# Patient Record
Sex: Female | Born: 1950
Health system: Southern US, Community
[De-identification: ages and names within clinical notes are randomized; demographics above are authoritative.]

## PROBLEM LIST (undated history)

## (undated) DIAGNOSIS — E7800 Pure hypercholesterolemia, unspecified: Secondary | ICD-10-CM

## (undated) DIAGNOSIS — F329 Major depressive disorder, single episode, unspecified: Secondary | ICD-10-CM

## (undated) DIAGNOSIS — I10 Essential (primary) hypertension: Secondary | ICD-10-CM

## (undated) DIAGNOSIS — E669 Obesity, unspecified: Secondary | ICD-10-CM

## (undated) DIAGNOSIS — C439 Malignant melanoma of skin, unspecified: Secondary | ICD-10-CM

## (undated) DIAGNOSIS — E0500 Thyrotoxicosis with diffuse goiter without thyrotoxic crisis or storm: Secondary | ICD-10-CM

## (undated) DIAGNOSIS — F32A Depression, unspecified: Secondary | ICD-10-CM

## (undated) HISTORY — DX: Thyrotoxicosis with diffuse goiter without thyrotoxic crisis or storm: E05.00

## (undated) HISTORY — DX: Essential (primary) hypertension: I10

## (undated) HISTORY — DX: Obesity, unspecified: E66.9

## (undated) HISTORY — PX: CHOLECYSTECTOMY: SHX55

## (undated) HISTORY — DX: Pure hypercholesterolemia, unspecified: E78.00

## (undated) HISTORY — DX: Depression, unspecified: F32.A

## (undated) HISTORY — DX: Major depressive disorder, single episode, unspecified: F32.9

## (undated) HISTORY — PX: TOTAL KNEE ARTHROPLASTY: SHX125

---
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
## 1999-05-21 ENCOUNTER — Other Ambulatory Visit: Admission: RE | Admit: 1999-05-21 | Discharge: 1999-05-21 | Payer: Self-pay | Admitting: Obstetrics and Gynecology

## 2000-05-04 ENCOUNTER — Other Ambulatory Visit: Admission: RE | Admit: 2000-05-04 | Discharge: 2000-05-04 | Payer: Self-pay | Admitting: Emergency Medicine

## 2000-11-27 ENCOUNTER — Emergency Department (HOSPITAL_COMMUNITY): Admission: EM | Admit: 2000-11-27 | Discharge: 2000-11-27 | Payer: Self-pay | Admitting: Emergency Medicine

## 2000-11-27 ENCOUNTER — Encounter: Payer: Self-pay | Admitting: Emergency Medicine

## 2004-02-25 ENCOUNTER — Ambulatory Visit (HOSPITAL_COMMUNITY): Admission: RE | Admit: 2004-02-25 | Discharge: 2004-02-25 | Payer: Self-pay | Admitting: Gastroenterology

## 2005-02-16 ENCOUNTER — Other Ambulatory Visit: Admission: RE | Admit: 2005-02-16 | Discharge: 2005-02-16 | Payer: Self-pay | Admitting: Family Medicine

## 2006-07-14 ENCOUNTER — Inpatient Hospital Stay (HOSPITAL_COMMUNITY): Admission: AD | Admit: 2006-07-14 | Discharge: 2006-07-15 | Payer: Self-pay | Admitting: Otolaryngology

## 2006-07-28 ENCOUNTER — Emergency Department (HOSPITAL_COMMUNITY): Admission: EM | Admit: 2006-07-28 | Discharge: 2006-07-28 | Payer: Self-pay | Admitting: Emergency Medicine

## 2007-12-01 IMAGING — CR DG CHEST 1V PORT
1 series · 1 of 1 positions shown · non-contrast
Comparison: none

CLINICAL DATA: PICC placement

Portable chest at 9797:
No previous for comparison. Right arm PICC line to the low SVC. Lungs clear.
Heart size upper limits normal. Mildly tortuous thoracic aorta. No effusion.
Visualized bones unremarkable.

[view not recorded]
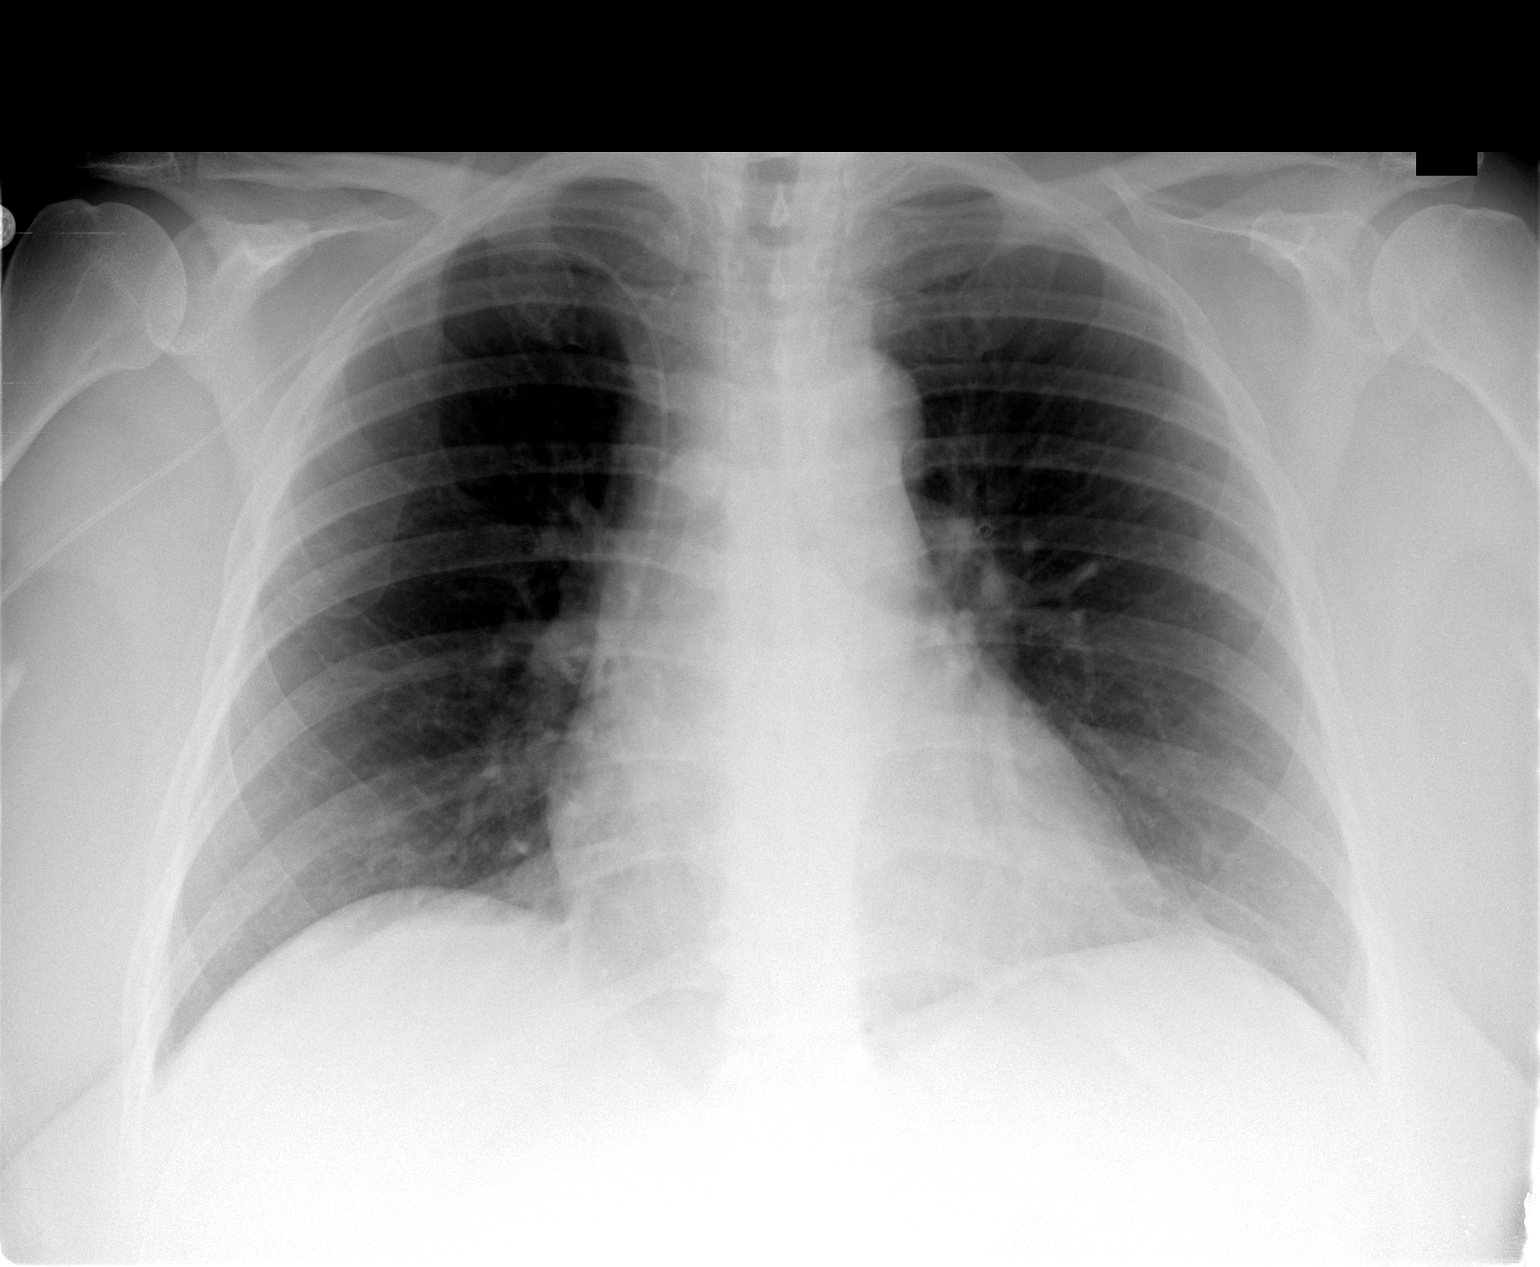

[1 of 1 positions shown; findings below may reference images not displayed]

IMPRESSION: 1. PICC to low SVC.

## 2007-12-15 IMAGING — CR DG CHEST 2V
2 series · 2 of 2 positions shown · non-contrast
Comparison: 07/14/2006

CLINICAL DATA: Syncope. MRSA

CHEST - 2 VIEW

[view not recorded (1 of 2)]
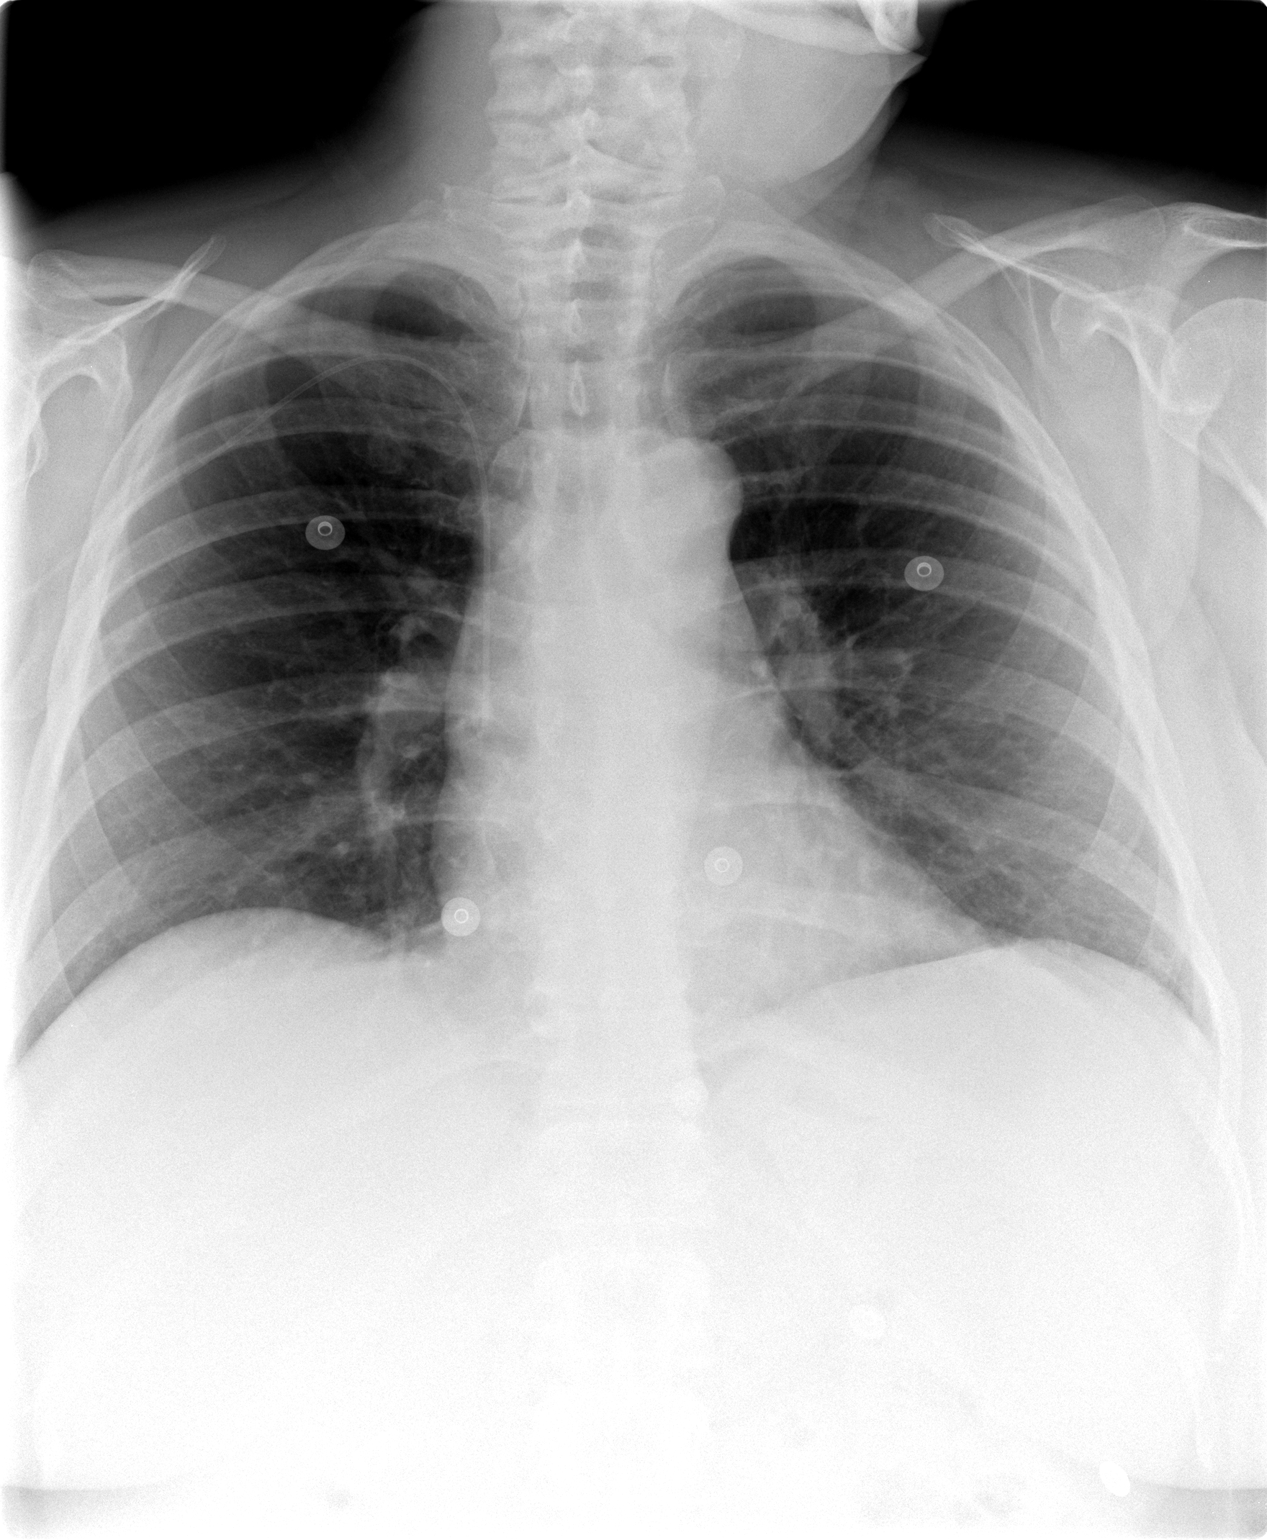

[view not recorded (2 of 2)]
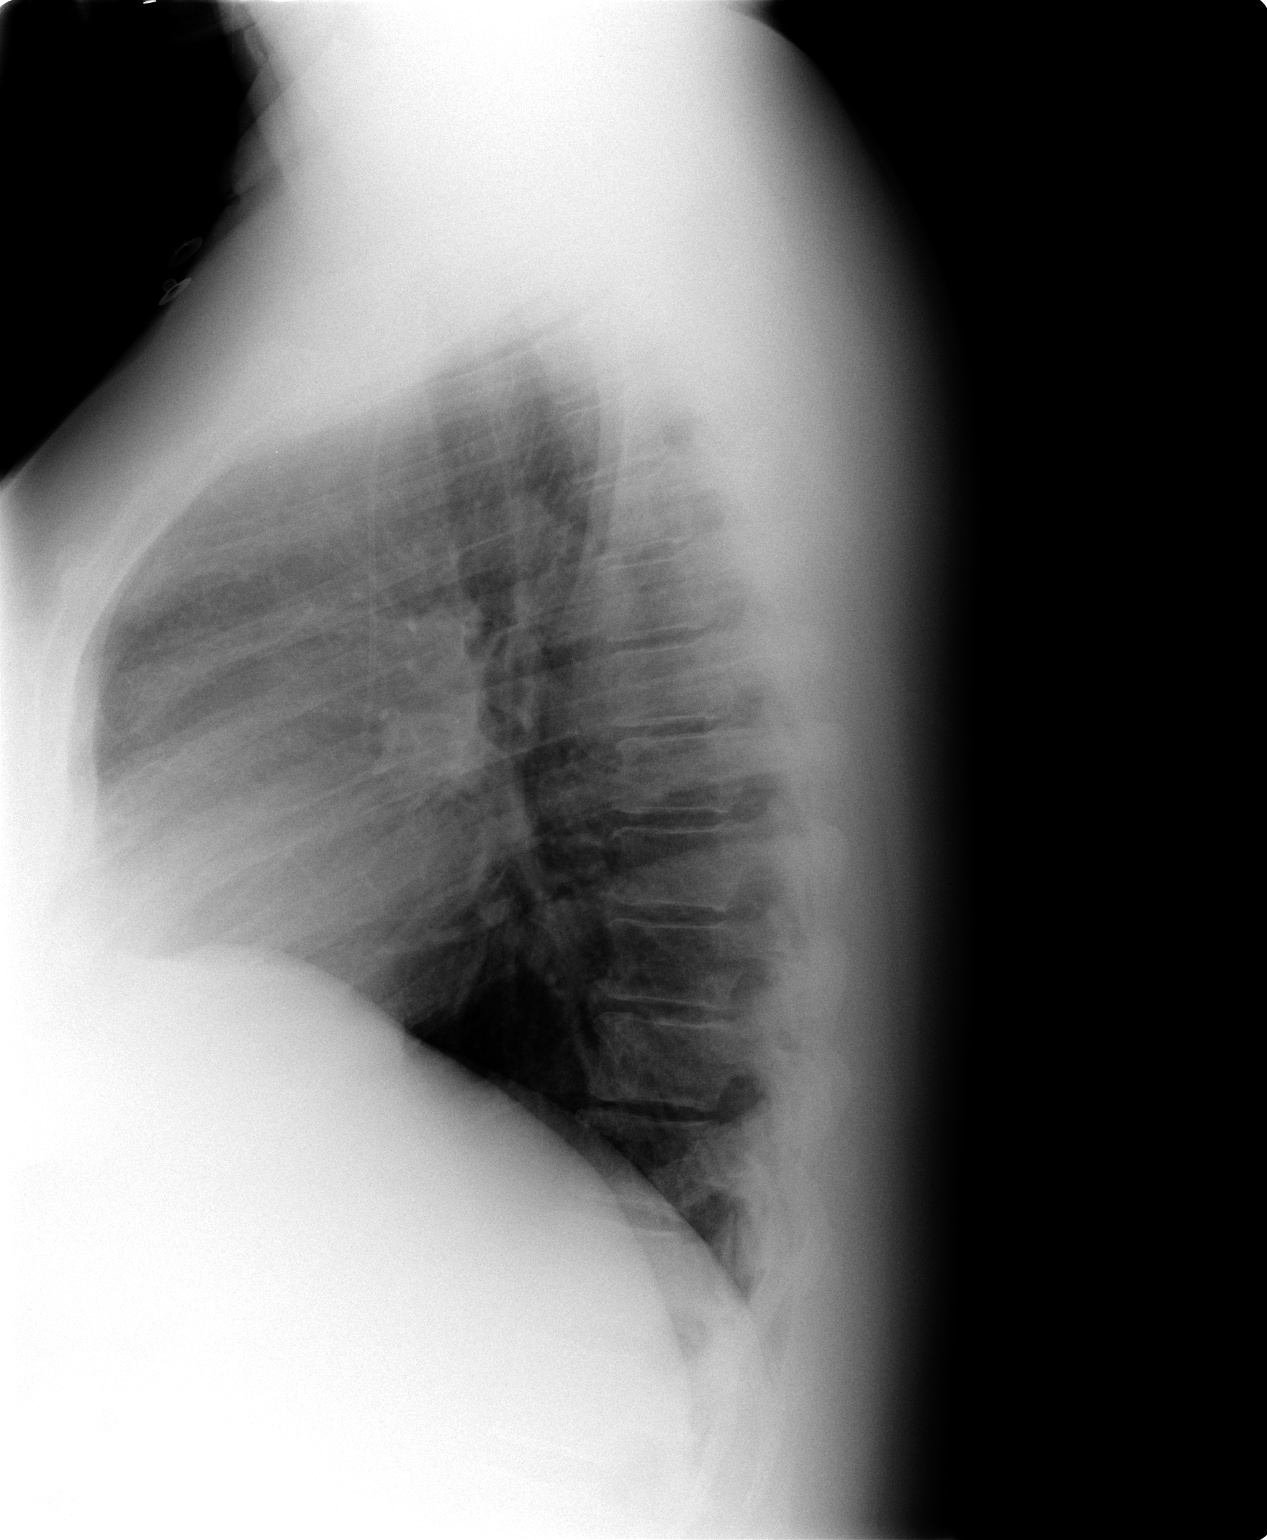

[2 of 2 positions shown; findings below may reference images not displayed]

FINDINGS: Right-sided PICC line tip projects over the superior vena cava. The
lungs appear clear. The heart and mediastinum appear unremarkable. No pleural
fluid is identified.

IMPRESSION

1. No active cardiopulmonary disease is radiographically apparent. PICC line tip
is in the superior vena cava.

## 2009-11-06 ENCOUNTER — Inpatient Hospital Stay (HOSPITAL_COMMUNITY): Admission: RE | Admit: 2009-11-06 | Discharge: 2009-11-09 | Payer: Self-pay | Admitting: Orthopaedic Surgery

## 2009-11-12 ENCOUNTER — Emergency Department (HOSPITAL_COMMUNITY): Admission: EM | Admit: 2009-11-12 | Discharge: 2009-11-12 | Payer: Self-pay | Admitting: Emergency Medicine

## 2009-11-23 ENCOUNTER — Emergency Department (HOSPITAL_COMMUNITY): Admission: EM | Admit: 2009-11-23 | Discharge: 2009-11-23 | Payer: Self-pay | Admitting: Emergency Medicine

## 2010-03-25 ENCOUNTER — Other Ambulatory Visit: Admission: RE | Admit: 2010-03-25 | Discharge: 2010-03-25 | Payer: Self-pay | Admitting: Family Medicine

## 2010-12-02 LAB — BASIC METABOLIC PANEL
BUN: 10 mg/dL (ref 6–23)
BUN: 6 mg/dL (ref 6–23)
CO2: 35 mEq/L — ABNORMAL HIGH (ref 19–32)
Calcium: 8.8 mg/dL (ref 8.4–10.5)
Calcium: 8.9 mg/dL (ref 8.4–10.5)
Chloride: 101 mEq/L (ref 96–112)
Chloride: 99 mEq/L (ref 96–112)
Creatinine, Ser: 0.77 mg/dL (ref 0.4–1.2)
Creatinine, Ser: 0.77 mg/dL (ref 0.4–1.2)
GFR calc Af Amer: >60 mL/min (ref >60)
GFR calc Af Amer: >60 mL/min (ref >60)
GFR calc non Af Amer: >60 mL/min (ref >60)
Glucose, Bld: 117 mg/dL — ABNORMAL HIGH (ref 70–99)
Glucose, Bld: 125 mg/dL — ABNORMAL HIGH (ref 70–99)
Sodium: 139 mEq/L (ref 135–145)

## 2010-12-02 LAB — CBC
HCT: 32.7 % — ABNORMAL LOW (ref 36.0–46.0)
HCT: 42.3 % (ref 36.0–46.0)
Hemoglobin: 11.9 g/dL — ABNORMAL LOW (ref 12.0–15.0)
Hemoglobin: 14.7 g/dL (ref 12.0–15.0)
MCHC: 35 g/dL (ref 30.0–36.0)
MCHC: 35.1 g/dL (ref 30.0–36.0)
MCHC: 35.3 g/dL (ref 30.0–36.0)
MCV: 89.7 fL (ref 78.0–100.0)
MCV: 89.7 fL (ref 78.0–100.0)
MCV: 90.1 fL (ref 78.0–100.0)
Platelets: 152 10*3/uL (ref 150–400)
Platelets: 200 10*3/uL (ref 150–400)
RBC: 3.79 MIL/uL — ABNORMAL LOW (ref 3.87–5.11)
RDW: 13.1 % (ref 11.5–15.5)
RDW: 13.2 % (ref 11.5–15.5)
RDW: 13.5 % (ref 11.5–15.5)
WBC: 8.1 10*3/uL (ref 4.0–10.5)
WBC: 8.9 10*3/uL (ref 4.0–10.5)

## 2010-12-02 LAB — PROTIME-INR
INR: 1.49 (ref 0.00–1.49)
Prothrombin Time: 21.4 seconds — ABNORMAL HIGH (ref 11.6–15.2)

## 2011-01-29 NOTE — Discharge Summary (Signed)
Penny Dawson, Penny Dawson               ACCOUNT NO.:  [id number]   MEDICAL RECORD NO.:  [id number]          PATIENT TYPE:  INP   LOCATION:  6706                         FACILITY:  [facility]   PHYSICIAN:  Lucky Cowboy, MD         DATE OF BIRTH:  [birth year]   DATE OF ADMISSION:  07/14/2006  DATE OF DISCHARGE:  07/15/2006                                 DISCHARGE SUMMARY   DISCHARGE DIAGNOSES:  1. Submental cellulitis with reactive lymphadenitis, most likely related      to methicillin-resistant staph aureus infection.  2. Graves' disease.  3. Hypertension.   HISTORY:  This patient is a 60 year old female with a 3-week history of a  submental tender swelling, not improved after 1 week of clindamycin.  She  was felt to be suffering from probable MRSA-related infection and for this  reason, she was admitted to the hospital for IV vancomycin.  While in the  hospital, she underwent a PICC line placement.  She underwent two doses of  vancomycin.  As expected, there was no change in the submental lymph node  size or tenderness level.  She was discharged to home on a planned 14-day IV  antibiotic therapy of vancomycin.  Vancomycin trough will be drawn tomorrow  morning.   Followup appointment is in the office on July 25, 2006 at 8:30 a.m.   DISCHARGE MEDICATIONS:  Baby aspirin, Maxzide, Zoloft, hold on allergy  shots, multivitamin, and then vancomycin 1.5 grams IV b.i.d.  Also, the  patient is to use Bactroban ointment in each side of the nasal cavities  b.i.d.      Lucky Cowboy, MD  Electronically Signed     SJ/MEDQ  D:  07/15/2006  T:  07/15/2006  Job:  [id number]   cc:   [facility]  Carola J. Gerri Spore, M.D.

## 2011-01-29 NOTE — Op Note (Signed)
Penny Dawson, MARION                              ACCOUNT NO.:  [id number]   MEDICAL RECORD NO.:  [id number]                   PATIENT TYPE:  AMB   LOCATION:  ENDO                                 FACILITY:  [facility]   PHYSICIAN:  James L. Malon Kindle., M.D.          DATE OF BIRTH:  11-09-1950   DATE OF PROCEDURE:  02/25/2004  DATE OF DISCHARGE:                                 OPERATIVE REPORT   PROCEDURE:  Colonoscopy.   MEDICATIONS:  Fentanyl 50 mcg, Versed 6 mg IV.   SCOPE:  Olympus pediatric adjustable colonoscope.   DESCRIPTION OF PROCEDURE:  The procedure had been explained to the patient  and consent obtained.  With the patient in the left lateral decubitus  position, the Olympus pediatric adjustable scope was inserted and advanced.  The prep was excellent.  We were able to advance to the cecum without a  great deal of difficulty.  The ileocecal valve and appendiceal orifice were  seen.  The scope was withdrawn and the cecum, ascending colon, transverse  colon, descending and sigmoid colon were seen well.  There were a few  diverticula in the sigmoid colon.  No other gross abnormalities.  The scope  was withdrawn into the rectum.  The rectum was free of polyps and lesions.  The patient tolerated the procedure well. Was maintained on a low-flow  oxygen and pulse oximetry throughout the procedure.   ASSESSMENT:  1. Normal screening colonoscopy. V76.51.  2. Diverticulosis.  562.10.   PLAN:  Will recommend yearly hemoccults and will give her a little  diverticulosis information sheet.                                               James L. Malon Kindle., M.D.    Waldron Session  D:  02/25/2004  T:  02/25/2004  Job:  [id number]   cc:   Otilio Connors. Gerri Spore, M.D.  [address]  [place]  [place] [address]  Fax: [phone]

## 2013-01-23 ENCOUNTER — Other Ambulatory Visit: Payer: Self-pay | Admitting: Dermatology

## 2014-04-09 ENCOUNTER — Other Ambulatory Visit (HOSPITAL_COMMUNITY)
Admission: RE | Admit: 2014-04-09 | Discharge: 2014-04-09 | Disposition: A | Discharge status: Home / Self Care | Payer: BC Managed Care – PPO | Source: Ambulatory Visit | Attending: Family Medicine | Admitting: Family Medicine

## 2014-04-09 ENCOUNTER — Other Ambulatory Visit: Payer: Self-pay | Admitting: Family Medicine

## 2014-04-09 DIAGNOSIS — Z124 Encounter for screening for malignant neoplasm of cervix: Secondary | ICD-10-CM | POA: Insufficient documentation

## 2014-04-09 DIAGNOSIS — Z1151 Encounter for screening for human papillomavirus (HPV): Secondary | ICD-10-CM | POA: Insufficient documentation

## 2014-04-11 LAB — CYTOLOGY - PAP

## 2015-02-12 ENCOUNTER — Other Ambulatory Visit: Payer: Self-pay | Admitting: Family Medicine

## 2015-02-12 DIAGNOSIS — R229 Localized swelling, mass and lump, unspecified: Secondary | ICD-10-CM

## 2015-02-19 ENCOUNTER — Ambulatory Visit
Admission: RE | Admit: 2015-02-19 | Discharge: 2015-02-19 | Disposition: A | Discharge status: Home / Self Care | Payer: Medicare Other | Source: Ambulatory Visit | Attending: Family Medicine | Admitting: Family Medicine

## 2015-02-19 DIAGNOSIS — R229 Localized swelling, mass and lump, unspecified: Secondary | ICD-10-CM

## 2015-03-24 ENCOUNTER — Ambulatory Visit
Admission: RE | Admit: 2015-03-24 | Discharge: 2015-03-24 | Disposition: A | Discharge status: Home / Self Care | Payer: Medicare Other | Source: Ambulatory Visit | Attending: Family Medicine | Admitting: Family Medicine

## 2015-03-24 ENCOUNTER — Other Ambulatory Visit: Payer: Self-pay | Admitting: Family Medicine

## 2015-03-24 DIAGNOSIS — S39012A Strain of muscle, fascia and tendon of lower back, initial encounter: Secondary | ICD-10-CM

## 2015-04-10 ENCOUNTER — Encounter: Payer: Self-pay | Admitting: *Deleted

## 2015-04-10 ENCOUNTER — Telehealth: Payer: Self-pay | Admitting: *Deleted

## 2015-04-11 ENCOUNTER — Encounter: Payer: Self-pay | Admitting: Internal Medicine

## 2015-04-11 ENCOUNTER — Ambulatory Visit (INDEPENDENT_AMBULATORY_CARE_PROVIDER_SITE_OTHER): Payer: Medicare Other | Admitting: Internal Medicine

## 2015-04-11 VITALS — BP 136/82 | HR 76 | Ht 65.0 in | Wt 262.8 lb

## 2015-04-11 DIAGNOSIS — R0602 Shortness of breath: Secondary | ICD-10-CM

## 2015-04-11 NOTE — Telephone Encounter (Signed)
Unable to reach pt to get family hx/status

## 2015-04-11 NOTE — Progress Notes (Addendum)
Cardiology Office Note   Date:  04/11/2015   ID:  Penny Dawson, DOB 05-24-1951, MRN [id number]  PCP:  No primary care provider on file.  Cardiologist:   Dorris Carnes, MD   No chief complaint on file.  Presents for evaluation of djzziness and dyspnea   History of Present Illness: Penny Dawson is a 64 y.o. female with a history of  HTN, HL, obesity   Has a long history of dizziness.  About 1 /1/2 month ago pt was working around house  It was hot  She got warm.  Nauseated  Dizzy  LAid down then event passed  Did not pass out   She has had tendency to this since childhood.    Passed out in Dr office 5 yars ago  Testing was negative  Including stress  Cardiologist was T Agricultural engineer.   Occurred while sick  Having testing done  Felt bad    Bowel moving frequently  Loose.  Last normal 2 month  Travelled to [place] in interval    More tired than used to be  SOB with walking up stairs  More SOB than usual Denies CP    Current Outpatient Prescriptions  Medication Sig Dispense Refill  . aspirin 81 MG tablet Take 81 mg by mouth daily.    . Biotin (BIOTIN 5000) 5 MG CAPS Take 1 capsule by mouth daily.    Marland Kitchen ibuprofen (ADVIL,MOTRIN) 200 MG tablet Take 200 mg by mouth every 6 (six) hours as needed.    . loratadine (CLARITIN) 10 MG tablet Take 10 mg by mouth daily.    . Multiple Vitamins-Minerals (MULTIVITAMIN & MINERAL PO) Take 1 capsule by mouth daily.    . sertraline (ZOLOFT) 100 MG tablet Take 50 mg by mouth daily.     Marland Kitchen triamterene-hydrochlorothiazide (DYAZIDE) 37.5-25 MG per capsule Take 1 capsule by mouth daily.     No current facility-administered medications for this visit.    Allergies:   Mycinaire nasal mist   Past Medical History  Diagnosis Date  . Obesity   . Graves disease   . Hypertension   . Elevated cholesterol   . Depression     Past Surgical History  Procedure Laterality Date  . Cholecystectomy    . Total knee arthroplasty      Left     Social History:   The patient  reports that she has never smoked. She does not have any smokeless tobacco history on file. She reports that she drinks alcohol. She reports that she does not use illicit drugs.   Family History:  The patient's family history includes Diabetes in her father; Heart attack (age of onset: 54) in her father; Heart disease in her father; Stroke in her father.    ROS:  Please see the history of present illness. All other systems are reviewed and  Negative to the above problem except as noted.    PHYSICAL EXAM: VS:  BP 136/82 mmHg  Pulse 76  Ht 5\' 5"  (1.651 m)  Wt 262 lb 12.8 oz (119.205 kg)  BMI 43.73 kg/m2  GEN: Well nourished, well developed, in no acute distress HEENT: normal Neck: no JVD, carotid bruits, or masses Cardiac: RRR; no murmurs, rubs, or gallops,no edema  Respiratory:  clear to auscultation bilaterally, normal work of breathing GI: soft, nontender, nondistended, + BS  No hepatomegaly  MS: no deformity Moving all extremities   Skin: warm and dry, no rash Neuro:  Strength and sensation are intact  Psych: euthymic mood, full affect   EKG:  EKG is ordered today.  SR 77 bpm  Occasional PVC  Nonspecific ST T wave changes      Lipid Panel No results found for: CHOL, TRIG, HDL, CHOLHDL, VLDL, LDLCALC, LDLDIRECT    Wt Readings from Last 3 Encounters:  04/11/15 262 lb 12.8 oz (119.205 kg)      ASSESSMENT AND PLAN:  1.  Dizziness, syncope  Pt has had a long history  At least some are vasovagal  I am not convinced of arrhythmia  Watch for symptoms and sit down  Stay hydrated  2  SOB  THis is more concerning  Dyspnea new over the past couple  FHx signif for CAD in dad and son I would recomm an echo to evalaute diastoic and systolic function  I would also recomm a stress myoview to eval for ischemia    F/U is based on test results.     Signed, Dorris Carnes, MD  04/11/2015 12:07 PM    [address], [place], [place]   [address] Phone: [phone]; Fax: [phone]

## 2015-04-11 NOTE — Patient Instructions (Signed)
Medication Instructions:  Your physician recommends that you continue on your current medications as directed. Please refer to the Current Medication list given to you today.   Labwork: None ordered  Testing/Procedures: Your physician has requested that you have en exercise stress myoview. For further information please visit [email or web address]. Please follow instruction sheet, as given.  Your physician has requested that you have an echocardiogram. Echocardiography is a painless test that uses sound waves to create images of your heart. It provides your doctor with information about the size and shape of your heart and how well your heart's chambers and valves are working. This procedure takes approximately one hour. There are no restrictions for this procedure.    Follow-Up: Your physician recommends that you schedule a follow-up appointment---will call after test results with follow up   Any Other Special Instructions Will Be Listed Below (If Applicable).

## 2015-04-14 ENCOUNTER — Ambulatory Visit (HOSPITAL_COMMUNITY): Payer: Medicare Other | Attending: Cardiovascular Disease

## 2015-04-14 DIAGNOSIS — R0602 Shortness of breath: Secondary | ICD-10-CM | POA: Diagnosis not present

## 2015-04-14 MED ORDER — REGADENOSON 0.4 MG/5ML IV SOLN: 0.4000 mg | Freq: Once | INTRAVENOUS | Status: AC

## 2015-04-14 MED ORDER — TECHNETIUM TC 99M SESTAMIBI GENERIC - CARDIOLITE
32.1000 | Freq: Once | INTRAVENOUS | Status: AC | PRN
  Administered 2015-04-14: 32.1 via INTRAVENOUS

## 2015-04-14 MED ORDER — TECHNETIUM TC 99M SESTAMIBI GENERIC - CARDIOLITE
10.2000 | Freq: Once | INTRAVENOUS | Status: AC | PRN
Start: 2015-04-14 — End: 2015-04-14
  Administered 2015-04-14: 10 via INTRAVENOUS

## 2015-04-15 ENCOUNTER — Ambulatory Visit (HOSPITAL_COMMUNITY): Payer: Medicare Other | Attending: Cardiology

## 2015-04-15 ENCOUNTER — Ambulatory Visit (HOSPITAL_BASED_OUTPATIENT_CLINIC_OR_DEPARTMENT_OTHER): Payer: Medicare Other

## 2015-04-15 ENCOUNTER — Other Ambulatory Visit: Payer: Self-pay

## 2015-04-15 DIAGNOSIS — I10 Essential (primary) hypertension: Secondary | ICD-10-CM | POA: Insufficient documentation

## 2015-04-15 DIAGNOSIS — R42 Dizziness and giddiness: Secondary | ICD-10-CM | POA: Diagnosis present

## 2015-04-15 DIAGNOSIS — I5189 Other ill-defined heart diseases: Secondary | ICD-10-CM | POA: Insufficient documentation

## 2015-04-15 DIAGNOSIS — R0602 Shortness of breath: Secondary | ICD-10-CM

## 2015-04-15 LAB — MYOCARDIAL PERFUSION IMAGING
Estimated workload: 6.4 METS
Exercise duration (min): 4 min
Exercise duration (sec): 31 s
LV dias vol: 92 mL
LV sys vol: 37 mL
MPHR: 155 {beats}/min
Peak HR: 141 {beats}/min
Percent HR: 90 %
RATE: 0.42
RPE: 17
Rest HR: 76 {beats}/min
SDS: 1
SRS: 1
SSS: 2
TID: 1.03

## 2015-04-15 MED ORDER — TECHNETIUM TC 99M SESTAMIBI GENERIC - CARDIOLITE
32.7000 | Freq: Once | INTRAVENOUS | Status: AC | PRN
  Administered 2015-04-15: 32.7 via INTRAVENOUS

## 2015-05-21 ENCOUNTER — Ambulatory Visit: Payer: Medicare Other | Admitting: Cardiology

## 2017-06-17 ENCOUNTER — Other Ambulatory Visit: Payer: Self-pay

## 2017-11-17 ENCOUNTER — Other Ambulatory Visit: Payer: Self-pay | Admitting: Physician Assistant

## 2017-11-17 DIAGNOSIS — C439 Malignant melanoma of skin, unspecified: Secondary | ICD-10-CM

## 2017-11-17 HISTORY — DX: Malignant melanoma of skin, unspecified: C43.9

## 2017-12-15 ENCOUNTER — Other Ambulatory Visit: Payer: Self-pay | Admitting: Dermatology

## 2018-03-21 ENCOUNTER — Other Ambulatory Visit: Payer: Self-pay | Admitting: Physician Assistant

## 2018-08-31 ENCOUNTER — Other Ambulatory Visit: Payer: Self-pay | Admitting: Physician Assistant

## 2018-08-31 DIAGNOSIS — H9120 Sudden idiopathic hearing loss, unspecified ear: Secondary | ICD-10-CM

## 2018-09-12 ENCOUNTER — Ambulatory Visit
Admission: RE | Admit: 2018-09-12 | Discharge: 2018-09-12 | Disposition: A | Discharge status: Home / Self Care | Payer: Medicare Other | Source: Ambulatory Visit | Attending: Physician Assistant | Admitting: Physician Assistant

## 2018-09-12 DIAGNOSIS — H9120 Sudden idiopathic hearing loss, unspecified ear: Secondary | ICD-10-CM

## 2018-09-12 MED ORDER — GADOBENATE DIMEGLUMINE 529 MG/ML IV SOLN
20.0000 mL | Freq: Once | INTRAVENOUS | Status: AC | PRN
  Administered 2018-09-12: 20 mL via INTRAVENOUS

## 2018-09-22 ENCOUNTER — Other Ambulatory Visit: Payer: Self-pay | Admitting: Physician Assistant

## 2018-11-28 ENCOUNTER — Other Ambulatory Visit: Payer: Self-pay | Admitting: Physician Assistant

## 2019-03-23 ENCOUNTER — Other Ambulatory Visit: Payer: Self-pay | Admitting: Physician Assistant

## 2019-04-05 ENCOUNTER — Encounter: Payer: Self-pay | Admitting: *Deleted

## 2020-03-19 ENCOUNTER — Encounter: Payer: Self-pay | Admitting: *Deleted

## 2020-03-25 ENCOUNTER — Ambulatory Visit: Payer: Medicare Other | Admitting: Physician Assistant

## 2020-04-01 ENCOUNTER — Other Ambulatory Visit: Payer: Self-pay

## 2020-04-01 ENCOUNTER — Ambulatory Visit: Payer: Medicare PPO | Admitting: Physician Assistant

## 2020-04-01 ENCOUNTER — Encounter: Payer: Self-pay | Admitting: Physician Assistant

## 2020-04-01 DIAGNOSIS — Z86006 Personal history of melanoma in-situ: Secondary | ICD-10-CM

## 2020-04-01 DIAGNOSIS — L821 Other seborrheic keratosis: Secondary | ICD-10-CM | POA: Diagnosis not present

## 2020-04-01 DIAGNOSIS — Z1283 Encounter for screening for malignant neoplasm of skin: Secondary | ICD-10-CM | POA: Diagnosis not present

## 2020-04-01 DIAGNOSIS — L814 Other melanin hyperpigmentation: Secondary | ICD-10-CM

## 2020-04-01 DIAGNOSIS — D1801 Hemangioma of skin and subcutaneous tissue: Secondary | ICD-10-CM | POA: Diagnosis not present

## 2020-04-01 DIAGNOSIS — D1800 Hemangioma unspecified site: Secondary | ICD-10-CM

## 2020-04-01 DIAGNOSIS — D229 Melanocytic nevi, unspecified: Secondary | ICD-10-CM

## 2020-04-01 DIAGNOSIS — L578 Other skin changes due to chronic exposure to nonionizing radiation: Secondary | ICD-10-CM

## 2020-04-01 NOTE — Progress Notes (Signed)
   Follow-Up Visit   Subjective  Penny Dawson is a 69 y.o. female who presents for the following: Follow-up (18mo).   The following portions of the chart were reviewed this encounter and updated as appropriate: Tobacco  Allergies  Meds  Problems  Med Hx  Surg Hx  Fam Hx      Objective  Well appearing patient in no apparent distress; mood and affect are within normal limits.  A full examination was performed including scalp, head, eyes, ears, nose, lips, neck, chest, axillae, abdomen, back, buttocks, bilateral upper extremities, bilateral lower extremities, hands, feet, fingers, toes, fingernails, and toenails. All findings within normal limits unless otherwise noted below.  Objective  Left Shoulder - Anterior: Scar clear, No DN or signs of NMSC No LAD  Objective  Left Abdomen (side) - Lower, Left Popliteal Fossa, Left Suprapubic Area, Left Thigh - Anterior (5), Left Upper Back, Mid Back, Right Thigh - Anterior (4), Right Thigh - Posterior, Right Upper Arm - Anterior, Right Upper Back: Stuck-on, waxy, tan-brown papules and plaques. --Discussed benign etiology and prognosis.    Assessment & Plan  Screening exam for skin cancer Head - Anterior (Face)  Personal history of melanoma in-situ Left Shoulder - Anterior  Seborrheic keratosis Chest - Medial (Center)  Hemangioma of skin (17) Left Popliteal Fossa; Right Upper Arm - Anterior; Left Thigh - Anterior (5); Right Thigh - Anterior (4); Right Thigh - Posterior; Left Abdomen (side) - Lower; Left Suprapubic Area; Left Upper Back; Right Upper Back; Mid Back  observe  Lentigines - Scattered tan macules - Discussed due to sun exposure - Benign, observe - Call for any changes  Seborrheic Keratoses - Stuck-on, waxy, tan-brown papules and plaques  - Discussed benign etiology and prognosis. - Observe - Call for any changes  Melanocytic Nevi - Tan-brown and/or pink-flesh-colored symmetric macules and papules - Benign  appearing on exam today - Observation - Call clinic for new or changing moles - Recommend daily use of broad spectrum spf 30+ sunscreen to sun-exposed areas.   Hemangiomas - Red papules - Discussed benign nature - Observe - Call for any changes  Actinic Damage - diffuse scaly erythematous macules with underlying dyspigmentation - Recommend daily broad spectrum sunscreen SPF 30+ to sun-exposed areas, reapply every 2 hours as needed.  - Call for new or changing lesions.  Skin cancer screening performed today. Full body   I, Amaree Leeper, PA-C, have reviewed all documentation's for this visit.  The documentation on 04/01/20 for the exam, diagnosis, procedures and orders are all accurate and complete.

## 2020-08-14 DIAGNOSIS — H353132 Nonexudative age-related macular degeneration, bilateral, intermediate dry stage: Secondary | ICD-10-CM | POA: Diagnosis not present

## 2020-08-25 DIAGNOSIS — E785 Hyperlipidemia, unspecified: Secondary | ICD-10-CM | POA: Diagnosis not present

## 2020-08-25 DIAGNOSIS — E0590 Thyrotoxicosis, unspecified without thyrotoxic crisis or storm: Secondary | ICD-10-CM | POA: Diagnosis not present

## 2020-08-25 DIAGNOSIS — I10 Essential (primary) hypertension: Secondary | ICD-10-CM | POA: Diagnosis not present

## 2020-08-25 DIAGNOSIS — E119 Type 2 diabetes mellitus without complications: Secondary | ICD-10-CM | POA: Diagnosis not present

## 2020-10-07 ENCOUNTER — Encounter: Payer: Self-pay | Admitting: Physician Assistant

## 2020-10-07 ENCOUNTER — Other Ambulatory Visit: Payer: Self-pay

## 2020-10-07 ENCOUNTER — Ambulatory Visit: Payer: Medicare PPO | Admitting: Physician Assistant

## 2020-10-07 DIAGNOSIS — Z1283 Encounter for screening for malignant neoplasm of skin: Secondary | ICD-10-CM | POA: Diagnosis not present

## 2020-10-07 DIAGNOSIS — Z85820 Personal history of malignant melanoma of skin: Secondary | ICD-10-CM | POA: Diagnosis not present

## 2020-10-07 DIAGNOSIS — D1800 Hemangioma unspecified site: Secondary | ICD-10-CM

## 2020-10-07 NOTE — Progress Notes (Signed)
   Follow-Up Visit   Subjective  Penny Dawson is a 70 y.o. female who presents for the following: Annual Exam (No new concerns).   The following portions of the chart were reviewed this encounter and updated as appropriate:  Tobacco  Allergies  Meds  Problems  Med Hx  Surg Hx  Fam Hx      Objective  Well appearing patient in no apparent distress; mood and affect are within normal limits.  A full examination was performed including scalp, head, eyes, ears, nose, lips, neck, chest, axillae, abdomen, back, buttocks, bilateral upper extremities, bilateral lower extremities, hands, feet, fingers, toes, fingernails, and toenails. All findings within normal limits unless otherwise noted below. No LAD  Objective  Left Outer Deltoid: White scar- clear  Objective  head to toe: Full body skin examination- no atypical moles or non mole skin cancer  Objective  scattered trunk: Multiple red raised papule   Assessment & Plan  Personal history of malignant melanoma of skin Left Outer Deltoid  Yearly skin check  Encounter for screening for malignant neoplasm of skin head to toe  Yearly skin check  Hemangioma, unspecified site scattered trunk  Okay to leave if stable    I, Orson Rho, PA-C, have reviewed all documentation's for this visit.  The documentation on 10/07/20 for the exam, diagnosis, procedures and orders are all accurate and complete.

## 2020-11-19 DIAGNOSIS — E0590 Thyrotoxicosis, unspecified without thyrotoxic crisis or storm: Secondary | ICD-10-CM | POA: Diagnosis not present

## 2020-11-19 DIAGNOSIS — I10 Essential (primary) hypertension: Secondary | ICD-10-CM | POA: Diagnosis not present

## 2020-11-19 DIAGNOSIS — E785 Hyperlipidemia, unspecified: Secondary | ICD-10-CM | POA: Diagnosis not present

## 2020-11-19 DIAGNOSIS — E119 Type 2 diabetes mellitus without complications: Secondary | ICD-10-CM | POA: Diagnosis not present

## 2020-11-24 DIAGNOSIS — H8103 Meniere's disease, bilateral: Secondary | ICD-10-CM | POA: Diagnosis not present

## 2020-11-24 DIAGNOSIS — Z6841 Body Mass Index (BMI) 40.0 and over, adult: Secondary | ICD-10-CM | POA: Diagnosis not present

## 2020-11-24 DIAGNOSIS — F330 Major depressive disorder, recurrent, mild: Secondary | ICD-10-CM | POA: Diagnosis not present

## 2020-11-24 DIAGNOSIS — E1165 Type 2 diabetes mellitus with hyperglycemia: Secondary | ICD-10-CM | POA: Diagnosis not present

## 2020-11-24 DIAGNOSIS — E785 Hyperlipidemia, unspecified: Secondary | ICD-10-CM | POA: Diagnosis not present

## 2020-11-24 DIAGNOSIS — H3530 Unspecified macular degeneration: Secondary | ICD-10-CM | POA: Diagnosis not present

## 2020-11-24 DIAGNOSIS — I10 Essential (primary) hypertension: Secondary | ICD-10-CM | POA: Diagnosis not present

## 2020-12-25 DIAGNOSIS — M720 Palmar fascial fibromatosis [Dupuytren]: Secondary | ICD-10-CM | POA: Diagnosis not present

## 2020-12-25 DIAGNOSIS — H353132 Nonexudative age-related macular degeneration, bilateral, intermediate dry stage: Secondary | ICD-10-CM | POA: Diagnosis not present

## 2021-01-19 ENCOUNTER — Other Ambulatory Visit: Payer: Self-pay

## 2021-01-19 DIAGNOSIS — G8918 Other acute postprocedural pain: Secondary | ICD-10-CM | POA: Diagnosis not present

## 2021-01-19 DIAGNOSIS — M720 Palmar fascial fibromatosis [Dupuytren]: Secondary | ICD-10-CM | POA: Diagnosis not present

## 2021-01-22 DIAGNOSIS — M720 Palmar fascial fibromatosis [Dupuytren]: Secondary | ICD-10-CM | POA: Diagnosis not present

## 2021-01-22 DIAGNOSIS — M25642 Stiffness of left hand, not elsewhere classified: Secondary | ICD-10-CM | POA: Diagnosis not present

## 2021-01-22 DIAGNOSIS — M79645 Pain in left finger(s): Secondary | ICD-10-CM | POA: Diagnosis not present

## 2021-02-05 ENCOUNTER — Ambulatory Visit: Payer: Medicare PPO | Admitting: Physician Assistant

## 2021-02-19 DIAGNOSIS — M720 Palmar fascial fibromatosis [Dupuytren]: Secondary | ICD-10-CM | POA: Diagnosis not present

## 2021-02-24 DIAGNOSIS — E119 Type 2 diabetes mellitus without complications: Secondary | ICD-10-CM | POA: Diagnosis not present

## 2021-02-24 DIAGNOSIS — Z7984 Long term (current) use of oral hypoglycemic drugs: Secondary | ICD-10-CM | POA: Diagnosis not present

## 2021-02-24 DIAGNOSIS — M79645 Pain in left finger(s): Secondary | ICD-10-CM | POA: Diagnosis not present

## 2021-02-24 DIAGNOSIS — M25642 Stiffness of left hand, not elsewhere classified: Secondary | ICD-10-CM | POA: Diagnosis not present

## 2021-02-24 DIAGNOSIS — M720 Palmar fascial fibromatosis [Dupuytren]: Secondary | ICD-10-CM | POA: Diagnosis not present

## 2021-02-25 DIAGNOSIS — H353132 Nonexudative age-related macular degeneration, bilateral, intermediate dry stage: Secondary | ICD-10-CM | POA: Diagnosis not present

## 2021-03-03 DIAGNOSIS — M79645 Pain in left finger(s): Secondary | ICD-10-CM | POA: Diagnosis not present

## 2021-03-03 DIAGNOSIS — M720 Palmar fascial fibromatosis [Dupuytren]: Secondary | ICD-10-CM | POA: Diagnosis not present

## 2021-03-03 DIAGNOSIS — M25642 Stiffness of left hand, not elsewhere classified: Secondary | ICD-10-CM | POA: Diagnosis not present

## 2021-03-10 DIAGNOSIS — M25642 Stiffness of left hand, not elsewhere classified: Secondary | ICD-10-CM | POA: Diagnosis not present

## 2021-03-10 DIAGNOSIS — M720 Palmar fascial fibromatosis [Dupuytren]: Secondary | ICD-10-CM | POA: Diagnosis not present

## 2021-03-10 DIAGNOSIS — M79645 Pain in left finger(s): Secondary | ICD-10-CM | POA: Diagnosis not present

## 2021-03-17 DIAGNOSIS — M79645 Pain in left finger(s): Secondary | ICD-10-CM | POA: Diagnosis not present

## 2021-03-17 DIAGNOSIS — M25642 Stiffness of left hand, not elsewhere classified: Secondary | ICD-10-CM | POA: Diagnosis not present

## 2021-03-24 DIAGNOSIS — M25642 Stiffness of left hand, not elsewhere classified: Secondary | ICD-10-CM | POA: Diagnosis not present

## 2021-03-24 DIAGNOSIS — M720 Palmar fascial fibromatosis [Dupuytren]: Secondary | ICD-10-CM | POA: Diagnosis not present

## 2021-03-24 DIAGNOSIS — M79645 Pain in left finger(s): Secondary | ICD-10-CM | POA: Diagnosis not present

## 2021-03-30 DIAGNOSIS — M720 Palmar fascial fibromatosis [Dupuytren]: Secondary | ICD-10-CM | POA: Diagnosis not present

## 2021-03-30 DIAGNOSIS — M79645 Pain in left finger(s): Secondary | ICD-10-CM | POA: Diagnosis not present

## 2021-03-30 DIAGNOSIS — M25642 Stiffness of left hand, not elsewhere classified: Secondary | ICD-10-CM | POA: Diagnosis not present

## 2021-04-07 ENCOUNTER — Encounter: Payer: Self-pay | Admitting: Physician Assistant

## 2021-04-07 ENCOUNTER — Ambulatory Visit: Payer: Medicare PPO | Admitting: Physician Assistant

## 2021-04-07 ENCOUNTER — Other Ambulatory Visit: Payer: Self-pay

## 2021-04-07 DIAGNOSIS — L905 Scar conditions and fibrosis of skin: Secondary | ICD-10-CM

## 2021-04-07 DIAGNOSIS — Z85820 Personal history of malignant melanoma of skin: Secondary | ICD-10-CM

## 2021-04-07 DIAGNOSIS — M720 Palmar fascial fibromatosis [Dupuytren]: Secondary | ICD-10-CM | POA: Diagnosis not present

## 2021-04-07 DIAGNOSIS — M25642 Stiffness of left hand, not elsewhere classified: Secondary | ICD-10-CM | POA: Diagnosis not present

## 2021-04-07 DIAGNOSIS — M79645 Pain in left finger(s): Secondary | ICD-10-CM | POA: Diagnosis not present

## 2021-04-07 DIAGNOSIS — L570 Actinic keratosis: Secondary | ICD-10-CM

## 2021-04-07 DIAGNOSIS — R29898 Other symptoms and signs involving the musculoskeletal system: Secondary | ICD-10-CM | POA: Diagnosis not present

## 2021-04-07 MED ORDER — CLOBETASOL PROPIONATE 0.05 % EX OINT: 1.0000 "application " | TOPICAL_OINTMENT | Freq: Every evening | CUTANEOUS | 6 refills | Status: AC

## 2021-04-07 NOTE — Progress Notes (Signed)
   Follow-Up Visit   Subjective  Penny Dawson is a 70 y.o. female who presents for the following: Follow-up (6 month follow up patient has history of mm, new hard lesion on the left ear x 2 months or more). Having problems with her left hand s/p dupuytrens contracture. She is currently doing physical therapy for the tendon contractures. The scars are very thick and painful.   The following portions of the chart were reviewed this encounter and updated as appropriate:  Tobacco  Allergies  Meds  Problems  Med Hx  Surg Hx  Fam Hx      Objective  Well appearing patient in no apparent distress; mood and affect are within normal limits.  A full examination was performed including scalp, head, eyes, ears, nose, lips, neck, chest, axillae, abdomen, back, buttocks, bilateral upper extremities, bilateral lower extremities, hands, feet, fingers, toes, fingernails, and toenails. All findings within normal limits unless otherwise noted below.  Right Superior Helix Erythematous patches with gritty scale.  Left Mid Palm Thick scarring palmar to all of her fingers.    Assessment & Plan  AK (actinic keratosis) Right Superior Helix  Destruction of lesion - Right Superior Helix Complexity: simple   Destruction method: cryotherapy   Informed consent: discussed and consent obtained   Timeout:  patient name, date of birth, surgical site, and procedure verified Lesion destroyed using liquid nitrogen: Yes   Cryotherapy cycles:  1 Outcome: patient tolerated procedure well with no complications   Post-procedure details: wound care instructions given    Scar Left Mid Palm  clobetasol ointment (TEMOVATE) 0.05 % - Left Mid Palm Apply 1 application topically at bedtime.    I, Ameli Sangiovanni, PA-C, have reviewed all documentation's for this visit.  The documentation on 04/07/21 for the exam, diagnosis, procedures and orders are all accurate and complete.

## 2021-04-15 DIAGNOSIS — R29898 Other symptoms and signs involving the musculoskeletal system: Secondary | ICD-10-CM | POA: Diagnosis not present

## 2021-04-15 DIAGNOSIS — M79645 Pain in left finger(s): Secondary | ICD-10-CM | POA: Diagnosis not present

## 2021-04-15 DIAGNOSIS — M25642 Stiffness of left hand, not elsewhere classified: Secondary | ICD-10-CM | POA: Diagnosis not present

## 2021-04-15 DIAGNOSIS — M720 Palmar fascial fibromatosis [Dupuytren]: Secondary | ICD-10-CM | POA: Diagnosis not present

## 2021-04-21 DIAGNOSIS — M25642 Stiffness of left hand, not elsewhere classified: Secondary | ICD-10-CM | POA: Diagnosis not present

## 2021-04-21 DIAGNOSIS — M720 Palmar fascial fibromatosis [Dupuytren]: Secondary | ICD-10-CM | POA: Diagnosis not present

## 2021-04-21 DIAGNOSIS — R29898 Other symptoms and signs involving the musculoskeletal system: Secondary | ICD-10-CM | POA: Diagnosis not present

## 2021-04-21 DIAGNOSIS — M79645 Pain in left finger(s): Secondary | ICD-10-CM | POA: Diagnosis not present

## 2021-04-28 DIAGNOSIS — G5602 Carpal tunnel syndrome, left upper limb: Secondary | ICD-10-CM | POA: Diagnosis not present

## 2021-04-28 DIAGNOSIS — M720 Palmar fascial fibromatosis [Dupuytren]: Secondary | ICD-10-CM | POA: Diagnosis not present

## 2021-05-19 DIAGNOSIS — E1165 Type 2 diabetes mellitus with hyperglycemia: Secondary | ICD-10-CM | POA: Diagnosis not present

## 2021-05-19 DIAGNOSIS — I10 Essential (primary) hypertension: Secondary | ICD-10-CM | POA: Diagnosis not present

## 2021-05-19 DIAGNOSIS — E785 Hyperlipidemia, unspecified: Secondary | ICD-10-CM | POA: Diagnosis not present

## 2021-05-26 DIAGNOSIS — Z1231 Encounter for screening mammogram for malignant neoplasm of breast: Secondary | ICD-10-CM | POA: Diagnosis not present

## 2021-06-29 DIAGNOSIS — Z79899 Other long term (current) drug therapy: Secondary | ICD-10-CM | POA: Diagnosis not present

## 2021-06-29 DIAGNOSIS — E1165 Type 2 diabetes mellitus with hyperglycemia: Secondary | ICD-10-CM | POA: Diagnosis not present

## 2021-06-29 DIAGNOSIS — I10 Essential (primary) hypertension: Secondary | ICD-10-CM | POA: Diagnosis not present

## 2021-07-20 DIAGNOSIS — Z79899 Other long term (current) drug therapy: Secondary | ICD-10-CM | POA: Diagnosis not present

## 2021-09-09 DIAGNOSIS — U071 COVID-19: Secondary | ICD-10-CM | POA: Diagnosis not present

## 2021-09-30 DIAGNOSIS — E1169 Type 2 diabetes mellitus with other specified complication: Secondary | ICD-10-CM | POA: Diagnosis not present

## 2021-09-30 DIAGNOSIS — E1165 Type 2 diabetes mellitus with hyperglycemia: Secondary | ICD-10-CM | POA: Diagnosis not present

## 2021-09-30 DIAGNOSIS — I10 Essential (primary) hypertension: Secondary | ICD-10-CM | POA: Diagnosis not present

## 2021-09-30 DIAGNOSIS — H353132 Nonexudative age-related macular degeneration, bilateral, intermediate dry stage: Secondary | ICD-10-CM | POA: Diagnosis not present

## 2021-09-30 DIAGNOSIS — H25811 Combined forms of age-related cataract, right eye: Secondary | ICD-10-CM | POA: Diagnosis not present

## 2021-09-30 DIAGNOSIS — E7800 Pure hypercholesterolemia, unspecified: Secondary | ICD-10-CM | POA: Diagnosis not present

## 2021-09-30 DIAGNOSIS — Z79899 Other long term (current) drug therapy: Secondary | ICD-10-CM | POA: Diagnosis not present

## 2021-09-30 DIAGNOSIS — E119 Type 2 diabetes mellitus without complications: Secondary | ICD-10-CM | POA: Diagnosis not present

## 2021-10-13 ENCOUNTER — Other Ambulatory Visit: Payer: Self-pay

## 2021-10-13 ENCOUNTER — Ambulatory Visit: Payer: Medicare PPO | Admitting: Physician Assistant

## 2021-10-13 ENCOUNTER — Encounter: Payer: Self-pay | Admitting: Physician Assistant

## 2021-10-13 DIAGNOSIS — Z1283 Encounter for screening for malignant neoplasm of skin: Secondary | ICD-10-CM

## 2021-10-13 NOTE — Progress Notes (Signed)
° °  Follow-Up Visit   Subjective  Penny Dawson is a 71 y.o. female who presents for the following: Annual Exam (Here for annual skin exam. No concerns. History of melanoma. ).   The following portions of the chart were reviewed this encounter and updated as appropriate:  Tobacco   Allergies   Meds   Problems   Med Hx   Surg Hx   Fam Hx       Objective  Well appearing patient in no apparent distress; mood and affect are within normal limits.  A full examination was performed including scalp, head, eyes, ears, nose, lips, neck, chest, axillae, abdomen, back, buttocks, bilateral upper extremities, bilateral lower extremities, hands, feet, fingers, toes, fingernails, and toenails. All findings within normal limits unless otherwise noted below.  head to toe No atypical nevi No signs of non-mole skin cancer. Dyspigmented scar (MM) clear.    Assessment & Plan  Screening for malignant neoplasm of skin head to toe  Biannual skin exams.     I, Larna Capelle, PA-C, have reviewed all documentation's for this visit.  The documentation on 10/13/21 for the exam, diagnosis, procedures and orders are all accurate and complete.

## 2021-11-05 DIAGNOSIS — Z01818 Encounter for other preprocedural examination: Secondary | ICD-10-CM | POA: Diagnosis not present

## 2021-11-05 DIAGNOSIS — H25811 Combined forms of age-related cataract, right eye: Secondary | ICD-10-CM | POA: Diagnosis not present

## 2021-11-05 DIAGNOSIS — H2511 Age-related nuclear cataract, right eye: Secondary | ICD-10-CM | POA: Diagnosis not present

## 2021-11-16 DIAGNOSIS — H2511 Age-related nuclear cataract, right eye: Secondary | ICD-10-CM | POA: Diagnosis not present

## 2021-12-03 DIAGNOSIS — Z0000 Encounter for general adult medical examination without abnormal findings: Secondary | ICD-10-CM | POA: Diagnosis not present

## 2021-12-03 DIAGNOSIS — I10 Essential (primary) hypertension: Secondary | ICD-10-CM | POA: Diagnosis not present

## 2021-12-03 DIAGNOSIS — E0590 Thyrotoxicosis, unspecified without thyrotoxic crisis or storm: Secondary | ICD-10-CM | POA: Diagnosis not present

## 2021-12-03 DIAGNOSIS — E7800 Pure hypercholesterolemia, unspecified: Secondary | ICD-10-CM | POA: Diagnosis not present

## 2021-12-03 DIAGNOSIS — E1165 Type 2 diabetes mellitus with hyperglycemia: Secondary | ICD-10-CM | POA: Diagnosis not present

## 2021-12-23 DIAGNOSIS — E1165 Type 2 diabetes mellitus with hyperglycemia: Secondary | ICD-10-CM | POA: Diagnosis not present

## 2022-01-06 DIAGNOSIS — M25561 Pain in right knee: Secondary | ICD-10-CM | POA: Diagnosis not present

## 2022-01-06 DIAGNOSIS — M25551 Pain in right hip: Secondary | ICD-10-CM | POA: Diagnosis not present

## 2022-01-06 DIAGNOSIS — M79661 Pain in right lower leg: Secondary | ICD-10-CM | POA: Diagnosis not present

## 2022-01-27 DIAGNOSIS — H35351 Cystoid macular degeneration, right eye: Secondary | ICD-10-CM | POA: Diagnosis not present

## 2022-01-27 DIAGNOSIS — H353132 Nonexudative age-related macular degeneration, bilateral, intermediate dry stage: Secondary | ICD-10-CM | POA: Diagnosis not present

## 2022-01-27 DIAGNOSIS — E119 Type 2 diabetes mellitus without complications: Secondary | ICD-10-CM | POA: Diagnosis not present

## 2022-03-09 DIAGNOSIS — N760 Acute vaginitis: Secondary | ICD-10-CM | POA: Diagnosis not present

## 2022-03-09 DIAGNOSIS — R300 Dysuria: Secondary | ICD-10-CM | POA: Diagnosis not present

## 2022-03-09 DIAGNOSIS — E118 Type 2 diabetes mellitus with unspecified complications: Secondary | ICD-10-CM | POA: Diagnosis not present

## 2022-03-09 DIAGNOSIS — N3000 Acute cystitis without hematuria: Secondary | ICD-10-CM | POA: Diagnosis not present

## 2022-03-19 DIAGNOSIS — H353132 Nonexudative age-related macular degeneration, bilateral, intermediate dry stage: Secondary | ICD-10-CM | POA: Diagnosis not present

## 2022-03-19 DIAGNOSIS — H35351 Cystoid macular degeneration, right eye: Secondary | ICD-10-CM | POA: Diagnosis not present

## 2022-03-19 DIAGNOSIS — E119 Type 2 diabetes mellitus without complications: Secondary | ICD-10-CM | POA: Diagnosis not present

## 2022-04-13 ENCOUNTER — Ambulatory Visit: Payer: Medicare PPO | Admitting: Physician Assistant

## 2022-04-29 DIAGNOSIS — E785 Hyperlipidemia, unspecified: Secondary | ICD-10-CM | POA: Diagnosis not present

## 2022-04-29 DIAGNOSIS — I10 Essential (primary) hypertension: Secondary | ICD-10-CM | POA: Diagnosis not present

## 2022-04-29 DIAGNOSIS — E1165 Type 2 diabetes mellitus with hyperglycemia: Secondary | ICD-10-CM | POA: Diagnosis not present

## 2022-05-20 DIAGNOSIS — E785 Hyperlipidemia, unspecified: Secondary | ICD-10-CM | POA: Diagnosis not present

## 2022-05-20 DIAGNOSIS — R5383 Other fatigue: Secondary | ICD-10-CM | POA: Diagnosis not present

## 2022-05-20 DIAGNOSIS — R252 Cramp and spasm: Secondary | ICD-10-CM | POA: Diagnosis not present

## 2022-05-20 DIAGNOSIS — E119 Type 2 diabetes mellitus without complications: Secondary | ICD-10-CM | POA: Diagnosis not present

## 2022-05-31 DIAGNOSIS — Z1231 Encounter for screening mammogram for malignant neoplasm of breast: Secondary | ICD-10-CM | POA: Diagnosis not present

## 2022-06-03 DIAGNOSIS — R195 Other fecal abnormalities: Secondary | ICD-10-CM | POA: Diagnosis not present

## 2022-06-07 DIAGNOSIS — K644 Residual hemorrhoidal skin tags: Secondary | ICD-10-CM | POA: Diagnosis not present

## 2022-06-07 DIAGNOSIS — D122 Benign neoplasm of ascending colon: Secondary | ICD-10-CM | POA: Diagnosis not present

## 2022-06-07 DIAGNOSIS — K5730 Diverticulosis of large intestine without perforation or abscess without bleeding: Secondary | ICD-10-CM | POA: Diagnosis not present

## 2022-06-07 DIAGNOSIS — R195 Other fecal abnormalities: Secondary | ICD-10-CM | POA: Diagnosis not present

## 2022-06-07 DIAGNOSIS — D120 Benign neoplasm of cecum: Secondary | ICD-10-CM | POA: Diagnosis not present

## 2022-06-09 DIAGNOSIS — D122 Benign neoplasm of ascending colon: Secondary | ICD-10-CM | POA: Diagnosis not present

## 2022-06-17 DIAGNOSIS — E1165 Type 2 diabetes mellitus with hyperglycemia: Secondary | ICD-10-CM | POA: Diagnosis not present

## 2022-06-17 DIAGNOSIS — M25551 Pain in right hip: Secondary | ICD-10-CM | POA: Diagnosis not present

## 2022-06-17 DIAGNOSIS — M25552 Pain in left hip: Secondary | ICD-10-CM | POA: Diagnosis not present

## 2022-07-01 DIAGNOSIS — M7061 Trochanteric bursitis, right hip: Secondary | ICD-10-CM | POA: Diagnosis not present

## 2022-07-01 DIAGNOSIS — M7062 Trochanteric bursitis, left hip: Secondary | ICD-10-CM | POA: Diagnosis not present

## 2022-09-02 DIAGNOSIS — G5602 Carpal tunnel syndrome, left upper limb: Secondary | ICD-10-CM | POA: Diagnosis not present

## 2022-09-02 DIAGNOSIS — M720 Palmar fascial fibromatosis [Dupuytren]: Secondary | ICD-10-CM | POA: Diagnosis not present

## 2022-09-14 DIAGNOSIS — I10 Essential (primary) hypertension: Secondary | ICD-10-CM | POA: Diagnosis not present

## 2022-09-14 DIAGNOSIS — E1169 Type 2 diabetes mellitus with other specified complication: Secondary | ICD-10-CM | POA: Diagnosis not present

## 2022-09-29 DIAGNOSIS — L918 Other hypertrophic disorders of the skin: Secondary | ICD-10-CM | POA: Diagnosis not present

## 2022-09-29 DIAGNOSIS — Z85820 Personal history of malignant melanoma of skin: Secondary | ICD-10-CM | POA: Diagnosis not present

## 2022-09-29 DIAGNOSIS — D2262 Melanocytic nevi of left upper limb, including shoulder: Secondary | ICD-10-CM | POA: Diagnosis not present

## 2022-09-29 DIAGNOSIS — D225 Melanocytic nevi of trunk: Secondary | ICD-10-CM | POA: Diagnosis not present

## 2022-09-29 DIAGNOSIS — L821 Other seborrheic keratosis: Secondary | ICD-10-CM | POA: Diagnosis not present

## 2022-09-29 DIAGNOSIS — L570 Actinic keratosis: Secondary | ICD-10-CM | POA: Diagnosis not present

## 2022-09-29 DIAGNOSIS — D2261 Melanocytic nevi of right upper limb, including shoulder: Secondary | ICD-10-CM | POA: Diagnosis not present

## 2022-10-01 DIAGNOSIS — E119 Type 2 diabetes mellitus without complications: Secondary | ICD-10-CM | POA: Diagnosis not present

## 2022-10-01 DIAGNOSIS — H353132 Nonexudative age-related macular degeneration, bilateral, intermediate dry stage: Secondary | ICD-10-CM | POA: Diagnosis not present

## 2022-10-01 DIAGNOSIS — H35351 Cystoid macular degeneration, right eye: Secondary | ICD-10-CM | POA: Diagnosis not present

## 2022-10-14 DIAGNOSIS — M720 Palmar fascial fibromatosis [Dupuytren]: Secondary | ICD-10-CM | POA: Diagnosis not present

## 2022-10-14 DIAGNOSIS — G5602 Carpal tunnel syndrome, left upper limb: Secondary | ICD-10-CM | POA: Diagnosis not present

## 2022-12-07 DIAGNOSIS — Z79899 Other long term (current) drug therapy: Secondary | ICD-10-CM | POA: Diagnosis not present

## 2022-12-07 DIAGNOSIS — E2839 Other primary ovarian failure: Secondary | ICD-10-CM | POA: Diagnosis not present

## 2022-12-07 DIAGNOSIS — I10 Essential (primary) hypertension: Secondary | ICD-10-CM | POA: Diagnosis not present

## 2022-12-07 DIAGNOSIS — Z0001 Encounter for general adult medical examination with abnormal findings: Secondary | ICD-10-CM | POA: Diagnosis not present

## 2022-12-07 DIAGNOSIS — E119 Type 2 diabetes mellitus without complications: Secondary | ICD-10-CM | POA: Diagnosis not present

## 2022-12-07 DIAGNOSIS — F325 Major depressive disorder, single episode, in full remission: Secondary | ICD-10-CM | POA: Diagnosis not present

## 2022-12-07 DIAGNOSIS — E7800 Pure hypercholesterolemia, unspecified: Secondary | ICD-10-CM | POA: Diagnosis not present

## 2022-12-14 DIAGNOSIS — Z780 Asymptomatic menopausal state: Secondary | ICD-10-CM | POA: Diagnosis not present

## 2023-03-28 DIAGNOSIS — H5202 Hypermetropia, left eye: Secondary | ICD-10-CM | POA: Diagnosis not present

## 2023-03-28 DIAGNOSIS — Z961 Presence of intraocular lens: Secondary | ICD-10-CM | POA: Diagnosis not present

## 2023-03-28 DIAGNOSIS — D3132 Benign neoplasm of left choroid: Secondary | ICD-10-CM | POA: Diagnosis not present

## 2023-03-28 DIAGNOSIS — H52223 Regular astigmatism, bilateral: Secondary | ICD-10-CM | POA: Diagnosis not present

## 2023-03-28 DIAGNOSIS — H3561 Retinal hemorrhage, right eye: Secondary | ICD-10-CM | POA: Diagnosis not present

## 2023-03-28 DIAGNOSIS — H353132 Nonexudative age-related macular degeneration, bilateral, intermediate dry stage: Secondary | ICD-10-CM | POA: Diagnosis not present

## 2023-03-28 DIAGNOSIS — H5211 Myopia, right eye: Secondary | ICD-10-CM | POA: Diagnosis not present

## 2023-03-28 DIAGNOSIS — H43813 Vitreous degeneration, bilateral: Secondary | ICD-10-CM | POA: Diagnosis not present

## 2023-03-28 DIAGNOSIS — H524 Presbyopia: Secondary | ICD-10-CM | POA: Diagnosis not present

## 2023-03-29 DIAGNOSIS — D2261 Melanocytic nevi of right upper limb, including shoulder: Secondary | ICD-10-CM | POA: Diagnosis not present

## 2023-03-29 DIAGNOSIS — L649 Androgenic alopecia, unspecified: Secondary | ICD-10-CM | POA: Diagnosis not present

## 2023-03-29 DIAGNOSIS — L821 Other seborrheic keratosis: Secondary | ICD-10-CM | POA: Diagnosis not present

## 2023-03-29 DIAGNOSIS — Z85820 Personal history of malignant melanoma of skin: Secondary | ICD-10-CM | POA: Diagnosis not present

## 2023-03-29 DIAGNOSIS — D2262 Melanocytic nevi of left upper limb, including shoulder: Secondary | ICD-10-CM | POA: Diagnosis not present

## 2023-03-29 DIAGNOSIS — L570 Actinic keratosis: Secondary | ICD-10-CM | POA: Diagnosis not present

## 2023-06-07 DIAGNOSIS — Z1231 Encounter for screening mammogram for malignant neoplasm of breast: Secondary | ICD-10-CM | POA: Diagnosis not present

## 2023-07-11 DIAGNOSIS — E1165 Type 2 diabetes mellitus with hyperglycemia: Secondary | ICD-10-CM | POA: Diagnosis not present

## 2023-07-11 DIAGNOSIS — E7800 Pure hypercholesterolemia, unspecified: Secondary | ICD-10-CM | POA: Diagnosis not present

## 2023-07-11 DIAGNOSIS — Z79899 Other long term (current) drug therapy: Secondary | ICD-10-CM | POA: Diagnosis not present

## 2023-07-12 DIAGNOSIS — Z79899 Other long term (current) drug therapy: Secondary | ICD-10-CM | POA: Diagnosis not present

## 2023-07-12 DIAGNOSIS — E1165 Type 2 diabetes mellitus with hyperglycemia: Secondary | ICD-10-CM | POA: Diagnosis not present

## 2023-07-12 DIAGNOSIS — E7800 Pure hypercholesterolemia, unspecified: Secondary | ICD-10-CM | POA: Diagnosis not present

## 2023-08-03 DIAGNOSIS — H353123 Nonexudative age-related macular degeneration, left eye, advanced atrophic without subfoveal involvement: Secondary | ICD-10-CM | POA: Diagnosis not present

## 2023-08-22 DIAGNOSIS — M1711 Unilateral primary osteoarthritis, right knee: Secondary | ICD-10-CM | POA: Diagnosis not present

## 2023-08-22 DIAGNOSIS — M1712 Unilateral primary osteoarthritis, left knee: Secondary | ICD-10-CM | POA: Diagnosis not present

## 2023-08-22 DIAGNOSIS — M170 Bilateral primary osteoarthritis of knee: Secondary | ICD-10-CM | POA: Diagnosis not present

## 2023-09-26 DIAGNOSIS — E119 Type 2 diabetes mellitus without complications: Secondary | ICD-10-CM | POA: Diagnosis not present

## 2023-09-26 DIAGNOSIS — H52223 Regular astigmatism, bilateral: Secondary | ICD-10-CM | POA: Diagnosis not present

## 2023-09-26 DIAGNOSIS — H5202 Hypermetropia, left eye: Secondary | ICD-10-CM | POA: Diagnosis not present

## 2023-09-26 DIAGNOSIS — H43813 Vitreous degeneration, bilateral: Secondary | ICD-10-CM | POA: Diagnosis not present

## 2023-09-26 DIAGNOSIS — D3132 Benign neoplasm of left choroid: Secondary | ICD-10-CM | POA: Diagnosis not present

## 2023-09-26 DIAGNOSIS — H353132 Nonexudative age-related macular degeneration, bilateral, intermediate dry stage: Secondary | ICD-10-CM | POA: Diagnosis not present

## 2023-09-26 DIAGNOSIS — H3561 Retinal hemorrhage, right eye: Secondary | ICD-10-CM | POA: Diagnosis not present

## 2023-09-26 DIAGNOSIS — Z961 Presence of intraocular lens: Secondary | ICD-10-CM | POA: Diagnosis not present

## 2023-09-26 DIAGNOSIS — H5211 Myopia, right eye: Secondary | ICD-10-CM | POA: Diagnosis not present

## 2023-09-27 DIAGNOSIS — D2262 Melanocytic nevi of left upper limb, including shoulder: Secondary | ICD-10-CM | POA: Diagnosis not present

## 2023-09-27 DIAGNOSIS — L821 Other seborrheic keratosis: Secondary | ICD-10-CM | POA: Diagnosis not present

## 2023-09-27 DIAGNOSIS — D2271 Melanocytic nevi of right lower limb, including hip: Secondary | ICD-10-CM | POA: Diagnosis not present

## 2023-09-27 DIAGNOSIS — L720 Epidermal cyst: Secondary | ICD-10-CM | POA: Diagnosis not present

## 2023-09-27 DIAGNOSIS — D225 Melanocytic nevi of trunk: Secondary | ICD-10-CM | POA: Diagnosis not present

## 2023-09-27 DIAGNOSIS — Z85820 Personal history of malignant melanoma of skin: Secondary | ICD-10-CM | POA: Diagnosis not present

## 2023-09-27 DIAGNOSIS — D2261 Melanocytic nevi of right upper limb, including shoulder: Secondary | ICD-10-CM | POA: Diagnosis not present

## 2023-09-27 DIAGNOSIS — L905 Scar conditions and fibrosis of skin: Secondary | ICD-10-CM | POA: Diagnosis not present

## 2023-09-27 DIAGNOSIS — M720 Palmar fascial fibromatosis [Dupuytren]: Secondary | ICD-10-CM | POA: Diagnosis not present

## 2023-10-07 DIAGNOSIS — H353123 Nonexudative age-related macular degeneration, left eye, advanced atrophic without subfoveal involvement: Secondary | ICD-10-CM | POA: Diagnosis not present

## 2023-10-24 DIAGNOSIS — M25561 Pain in right knee: Secondary | ICD-10-CM | POA: Diagnosis not present

## 2023-12-02 DIAGNOSIS — M1711 Unilateral primary osteoarthritis, right knee: Secondary | ICD-10-CM | POA: Diagnosis not present

## 2023-12-09 DIAGNOSIS — M1711 Unilateral primary osteoarthritis, right knee: Secondary | ICD-10-CM | POA: Diagnosis not present

## 2023-12-12 DIAGNOSIS — E119 Type 2 diabetes mellitus without complications: Secondary | ICD-10-CM | POA: Diagnosis not present

## 2023-12-12 DIAGNOSIS — Z79899 Other long term (current) drug therapy: Secondary | ICD-10-CM | POA: Diagnosis not present

## 2023-12-12 DIAGNOSIS — E7800 Pure hypercholesterolemia, unspecified: Secondary | ICD-10-CM | POA: Diagnosis not present

## 2023-12-12 DIAGNOSIS — E1165 Type 2 diabetes mellitus with hyperglycemia: Secondary | ICD-10-CM | POA: Diagnosis not present

## 2023-12-14 DIAGNOSIS — Z1331 Encounter for screening for depression: Secondary | ICD-10-CM | POA: Diagnosis not present

## 2023-12-14 DIAGNOSIS — Z79899 Other long term (current) drug therapy: Secondary | ICD-10-CM | POA: Diagnosis not present

## 2023-12-14 DIAGNOSIS — E7800 Pure hypercholesterolemia, unspecified: Secondary | ICD-10-CM | POA: Diagnosis not present

## 2023-12-14 DIAGNOSIS — E1165 Type 2 diabetes mellitus with hyperglycemia: Secondary | ICD-10-CM | POA: Diagnosis not present

## 2023-12-14 DIAGNOSIS — H9191 Unspecified hearing loss, right ear: Secondary | ICD-10-CM | POA: Diagnosis not present

## 2023-12-14 DIAGNOSIS — Z0000 Encounter for general adult medical examination without abnormal findings: Secondary | ICD-10-CM | POA: Diagnosis not present

## 2023-12-16 DIAGNOSIS — M1711 Unilateral primary osteoarthritis, right knee: Secondary | ICD-10-CM | POA: Diagnosis not present

## 2024-01-06 DIAGNOSIS — R350 Frequency of micturition: Secondary | ICD-10-CM | POA: Diagnosis not present

## 2024-01-06 DIAGNOSIS — M545 Low back pain, unspecified: Secondary | ICD-10-CM | POA: Diagnosis not present

## 2024-01-06 DIAGNOSIS — R509 Fever, unspecified: Secondary | ICD-10-CM | POA: Diagnosis not present

## 2024-01-09 ENCOUNTER — Emergency Department (HOSPITAL_BASED_OUTPATIENT_CLINIC_OR_DEPARTMENT_OTHER): Admitting: Radiology

## 2024-01-09 ENCOUNTER — Emergency Department (HOSPITAL_BASED_OUTPATIENT_CLINIC_OR_DEPARTMENT_OTHER)
Admission: EM | Admit: 2024-01-09 | Discharge: 2024-01-09 | Disposition: A | Discharge status: Home / Self Care | Attending: Emergency Medicine | Admitting: Emergency Medicine

## 2024-01-09 ENCOUNTER — Emergency Department (HOSPITAL_BASED_OUTPATIENT_CLINIC_OR_DEPARTMENT_OTHER)

## 2024-01-09 ENCOUNTER — Encounter (HOSPITAL_BASED_OUTPATIENT_CLINIC_OR_DEPARTMENT_OTHER): Payer: Self-pay

## 2024-01-09 ENCOUNTER — Other Ambulatory Visit: Payer: Self-pay

## 2024-01-09 DIAGNOSIS — J189 Pneumonia, unspecified organism: Secondary | ICD-10-CM

## 2024-01-09 DIAGNOSIS — R509 Fever, unspecified: Secondary | ICD-10-CM | POA: Diagnosis not present

## 2024-01-09 DIAGNOSIS — Z7982 Long term (current) use of aspirin: Secondary | ICD-10-CM | POA: Insufficient documentation

## 2024-01-09 DIAGNOSIS — R0602 Shortness of breath: Secondary | ICD-10-CM | POA: Diagnosis not present

## 2024-01-09 DIAGNOSIS — R55 Syncope and collapse: Secondary | ICD-10-CM | POA: Diagnosis not present

## 2024-01-09 DIAGNOSIS — R918 Other nonspecific abnormal finding of lung field: Secondary | ICD-10-CM | POA: Diagnosis not present

## 2024-01-09 DIAGNOSIS — J181 Lobar pneumonia, unspecified organism: Secondary | ICD-10-CM | POA: Diagnosis not present

## 2024-01-09 DIAGNOSIS — J984 Other disorders of lung: Secondary | ICD-10-CM | POA: Diagnosis not present

## 2024-01-09 LAB — COMPREHENSIVE METABOLIC PANEL WITH GFR
ALT: 16 U/L (ref 0–44)
AST: 17 U/L (ref 15–41)
Albumin: 3.8 g/dL (ref 3.5–5.0)
Alkaline Phosphatase: 84 U/L (ref 38–126)
Anion gap: 12 (ref 5–15)
BUN: 12 mg/dL (ref 8–23)
CO2: 26 mmol/L (ref 22–32)
Calcium: 9.4 mg/dL (ref 8.9–10.3)
Chloride: 101 mmol/L (ref 98–111)
Creatinine, Ser: 0.85 mg/dL (ref 0.44–1.00)
GFR, Estimated: >60 mL/min (ref >60)
Glucose, Bld: 131 mg/dL — ABNORMAL HIGH (ref 70–99)
Potassium: 3.3 mmol/L — ABNORMAL LOW (ref 3.5–5.1)
Sodium: 138 mmol/L (ref 135–145)
Total Bilirubin: 0.8 mg/dL (ref 0.0–1.2)
Total Protein: 6.4 g/dL — ABNORMAL LOW (ref 6.5–8.1)

## 2024-01-09 LAB — CBC
HCT: 33.6 % — ABNORMAL LOW (ref 36.0–46.0)
Hemoglobin: 11.6 g/dL — ABNORMAL LOW (ref 12.0–15.0)
MCH: 30.2 pg (ref 26.0–34.0)
MCHC: 34.5 g/dL (ref 30.0–36.0)
MCV: 87.5 fL (ref 80.0–100.0)
Platelets: 152 10*3/uL (ref 150–400)
RBC: 3.84 MIL/uL — ABNORMAL LOW (ref 3.87–5.11)
RDW: 13.5 % (ref 11.5–15.5)
WBC: 12.1 10*3/uL — ABNORMAL HIGH (ref 4.0–10.5)
nRBC: 0 % (ref 0.0–0.2)

## 2024-01-09 LAB — URINALYSIS, ROUTINE W REFLEX MICROSCOPIC
Bilirubin Urine: NEGATIVE
Glucose, UA: NEGATIVE mg/dL
Hgb urine dipstick: NEGATIVE
Ketones, ur: NEGATIVE mg/dL
Leukocytes,Ua: NEGATIVE
Nitrite: NEGATIVE
Specific Gravity, Urine: 1.015 (ref 1.005–1.030)
pH: 6 (ref 5.0–8.0)

## 2024-01-09 LAB — LACTIC ACID, PLASMA: Lactic Acid, Venous: 0.9 mmol/L (ref 0.5–1.9)

## 2024-01-09 LAB — RESP PANEL BY RT-PCR (RSV, FLU A&B, COVID)  RVPGX2
Influenza A by PCR: NEGATIVE
Influenza B by PCR: NEGATIVE
Resp Syncytial Virus by PCR: NEGATIVE
SARS Coronavirus 2 by RT PCR: NEGATIVE

## 2024-01-09 LAB — TROPONIN T, HIGH SENSITIVITY: Troponin T High Sensitivity: <15 ng/L (ref <19)

## 2024-01-09 MED ORDER — LACTATED RINGERS IV BOLUS
1000.0000 mL | Freq: Once | INTRAVENOUS | Status: AC
  Administered 2024-01-09: 1000 mL via INTRAVENOUS

## 2024-01-09 MED ORDER — CEFPODOXIME PROXETIL 100 MG PO TABS: 100.0000 mg | ORAL_TABLET | Freq: Two times a day (BID) | ORAL | 0 refills | Status: DC

## 2024-01-09 MED ORDER — SODIUM CHLORIDE 0.9 % IV SOLN
1.0000 g | Freq: Once | INTRAVENOUS | Status: AC
  Administered 2024-01-09: 1 g via INTRAVENOUS
  Filled 2024-01-09: qty 10

## 2024-01-09 MED ORDER — ALBUTEROL SULFATE HFA 108 (90 BASE) MCG/ACT IN AERS: 2.0000 | INHALATION_SPRAY | RESPIRATORY_TRACT | Status: DC | PRN

## 2024-01-09 NOTE — ED Notes (Addendum)
 Blood cultures drawn x2 before starting ATB.

## 2024-01-09 NOTE — ED Triage Notes (Signed)
 Pt c/o witnessed syncopal episode- "URI, HA, what I thought was a UTI since Thursday," seen at PCP & given RX abx, compliant w same. States she is feeling "a little better, but not quite."

## 2024-01-09 NOTE — ED Notes (Signed)
Reviewed discharge instructions, medications, and home care with pt. Pt verbalized understanding and had no further questions. Pt exited ED without complications.

## 2024-01-09 NOTE — ED Provider Notes (Signed)
 Care of patient received from prior provider at 6:58 PM, please see their note for complete H/P and care plan.  Received handoff per ED course.  Clinical Course as of 01/09/24 1858  Mon Jan 09, 2024  1516 Stable 79 YOF with a chief complaint of syncope 3rd day of fever/malaise diagnosed with UTI. Syncopized in clinic today. Temp 102 CXR. Labs and likely admit. [CC]    Clinical Course User Index [CC] Onetha Bile, MD    Reassessment: Reassessed Ms. Plemmons after 6 hours of observation.  She states that she is completely symptomatically resolved. She has a left lower lobe pneumonia has been treated with Rocephin and plan initially was for admission to the hospital for definitive care and management after antibiotics. However patient states that she is insistent on outpatient management. She states that she feels grossly symptomatically improved feels comfortable ambulating, tolerating p.o. intake and has her family to assist with some of her ADLs as needed.  Vital signs reviewed and grossly stable.  She is a little dyspneic on exertion but at rest she is very comfortable. Will broaden patient to third-generation oral cephalosporin since first-generation cephalosporin was unsuccessful at controlling infection.  IV Rocephin already initiated, p.o. cefpodoxime for outpatient management. We discussed risk of failure of this medication regimen given failure of cephalexin but patient/family was still insistent on outpatient care and management.  Disposition:  Patient is requesting discharge at this time.  Given patient's understanding of risk of severe missed diagnosis based on limitations of today's evaluation and risk of interval worsening of disease including life or limb threatening pathology, will participate in shared medical decision making and patient directed discharge at this time.  Patient is welcome to return for further diagnostic evaluation/therapeutic management at any  time.      Onetha Bile, MD 01/09/24 [phone]

## 2024-01-09 NOTE — ED Provider Notes (Addendum)
  EMERGENCY DEPARTMENT AT [facility] Provider Note   CSN: [id number] Arrival date & time: 01/09/24  1218     History  Chief Complaint  Patient presents with   Loss of Consciousness    Penny Dawson is a 73 y.o. female.  Pt indicates fevers in past four days, temp 101 at home. Indicates went to pcp last Thursday, mentioned urinating frequently, and was prescribed antibiotic (?keflex) for possible uti.  Went to back to office today, was told no uti, but while in chair, started feeling faint and had syncopal event. Pt indicates long hx same and that it is not necessarily unusual for her to have syncopal episode when not feeling well, indicates hx vasovagal syncope. Denies other recent syncope. No associated chest pain or discomfort. No sob. No palpitations. Denies blood loss, vaginal bleeding, rectal bleeding or melena. No other recent change in meds. Indicates when temp was checked in office it was normal. Denies severe headache. +sinus congestion, occasional non prod cough. No specific known ill contacts. No chest pain. No sob. No abd pain or nvd. No dysuria. No extremity pain or swelling. No rash. W syncopal episode, stayed in chair, no trauma or injury.   The history is provided by the patient, medical records and the spouse.  Loss of Consciousness Associated symptoms: fever   Associated symptoms: no chest pain, no palpitations, no shortness of breath, no vomiting and no weakness        Home Medications Prior to Admission medications   Medication Sig Start Date End Date Taking? Authorizing Provider  aspirin 81 MG tablet Take 81 mg by mouth daily.    [provider]  Biotin 5 MG CAPS Take 1 capsule by mouth daily.    [provider]  empagliflozin (JARDIANCE) 10 MG TABS tablet 1 tablet 06/29/21   [provider]  ibuprofen (ADVIL,MOTRIN) 200 MG tablet Take 200 mg by mouth every 6 (six) hours as needed.    [provider]   loratadine (CLARITIN) 10 MG tablet Take 10 mg by mouth daily.    [provider]  Multiple Vitamins-Minerals (MULTIVITAMIN & MINERAL PO) Take 1 capsule by mouth daily.    [provider]  sertraline (ZOLOFT) 100 MG tablet Take 50 mg by mouth daily.  01/30/15   [provider]  triamterene-hydrochlorothiazide (DYAZIDE) 37.5-25 MG per capsule Take 1 capsule by mouth daily.    [provider]      Allergies    Macrolides and ketolides and Clindamycin/lincomycin    Review of Systems   Review of Systems  Constitutional:  Positive for fever.  HENT:  Positive for congestion and rhinorrhea. Negative for sore throat.   Eyes:  Negative for pain, redness and visual disturbance.  Respiratory:  Negative for shortness of breath.   Cardiovascular:  Positive for syncope. Negative for chest pain, palpitations and leg swelling.  Gastrointestinal:  Negative for abdominal pain, blood in stool, diarrhea and vomiting.  Genitourinary:  Negative for dysuria and flank pain.  Musculoskeletal:  Negative for back pain and neck pain.  Skin:  Negative for rash.  Neurological:  Positive for syncope. Negative for weakness and numbness.    Physical Exam Updated Vital Signs BP (!) 122/58 (BP Location: Left Arm)   Pulse 84   Temp 99 F (37.2 C)   Resp 16   SpO2 91%  Physical Exam Vitals and nursing note reviewed.  Constitutional:      Appearance: Normal appearance. She is well-developed.  HENT:     Head: Atraumatic.     Comments: No sinus or temporal tenderness.     Right Ear: Tympanic membrane normal.     Left Ear: Tympanic membrane normal.     Nose: Nose normal.     Mouth/Throat:     Mouth: Mucous membranes are moist.     Pharynx: Oropharynx is clear. No oropharyngeal exudate or posterior oropharyngeal erythema.  Eyes:     General: No scleral icterus.    Conjunctiva/sclera: Conjunctivae normal.     Pupils: Pupils are equal, round, and reactive to light.  Neck:      Trachea: No tracheal deviation.     Comments: Trachea midline. Thyroid  not grossly enlarged or tender. No neck stiffness or rigidity.  Cardiovascular:     Rate and Rhythm: Normal rate and regular rhythm.     Pulses: Normal pulses.     Heart sounds: Normal heart sounds. No murmur heard.    No friction rub. No gallop.  Pulmonary:     Effort: Pulmonary effort is normal. No respiratory distress.     Breath sounds: Normal breath sounds.  Abdominal:     General: Bowel sounds are normal. There is no distension.     Palpations: Abdomen is soft.     Tenderness: There is no abdominal tenderness.  Genitourinary:    Comments: No cva tenderness.  Musculoskeletal:        General: No swelling or tenderness.     Cervical back: Normal range of motion and neck supple. No rigidity. No muscular tenderness.     Right lower leg: No edema.     Left lower leg: No edema.     Comments: CTLS spine, non tender, aligned, no step off. No focal extremity pain or swelling.   Lymphadenopathy:     Cervical: No cervical adenopathy.  Skin:    General: Skin is warm and dry.     Findings: No rash.  Neurological:     Mental Status: She is alert.     Comments: Alert, speech normal. Motor/sens grossly intact bil.  Psychiatric:        Mood and Affect: Mood normal.     ED Results / Procedures / Treatments   Labs (all labs ordered are listed, but only abnormal results are displayed) Results for orders placed or performed during the hospital encounter of 01/09/24  Urinalysis, Routine w reflex microscopic -Urine, Clean Catch   Collection Time: 01/09/24  1:50 PM  Result Value Ref Range   Color, Urine YELLOW YELLOW   APPearance CLEAR CLEAR   Specific Gravity, Urine 1.015 1.005 - 1.030   pH 6.0 5.0 - 8.0   Glucose, UA NEGATIVE NEGATIVE mg/dL   Hgb urine dipstick NEGATIVE NEGATIVE   Bilirubin Urine NEGATIVE NEGATIVE   Ketones, ur NEGATIVE NEGATIVE mg/dL   Protein, ur TRACE (A) NEGATIVE mg/dL   Nitrite NEGATIVE  NEGATIVE   Leukocytes,Ua NEGATIVE NEGATIVE   DG Chest 2 View Result Date: 01/09/2024 CLINICAL DATA:  Shortness of breath.  Syncopal episode. EXAM: CHEST - 2 VIEW COMPARISON:  Radiographs 11/12/2009 and 11/03/2009. FINDINGS: The heart size and mediastinal contours are stable. There is focal airspace disease medially in the left lower lobe, best seen on the lateral view. The right lung appears clear. No pleural effusion or pneumothorax. Mild degenerative changes in the spine without evidence of acute osseous abnormality. IMPRESSION: Focal left lower lobe airspace disease suspicious for pneumonia. Correlate clinically. Followup PA and lateral chest X-ray is recommended in 4-6  weeks following appropriate therapy to ensure resolution and exclude underlying malignancy. Electronically Signed   By: Elmon Hagedorn M.D.   On: 01/09/2024 13:46    EKG EKG Interpretation Date/Time:  Monday January 09 2024 12:42:03 EDT Ventricular Rate:  83 PR Interval:  126 QRS Duration:  86 QT Interval:  390 QTC Calculation: 458 R Axis:   25  Text Interpretation: Normal sinus rhythm Nonspecific ST and T wave abnormality Confirmed by Guadalupe Lee ([id number]) on 01/09/2024 12:55:46 PM  Radiology DG Chest 2 View Result Date: 01/09/2024 CLINICAL DATA:  Shortness of breath.  Syncopal episode. EXAM: CHEST - 2 VIEW COMPARISON:  Radiographs 11/12/2009 and 11/03/2009. FINDINGS: The heart size and mediastinal contours are stable. There is focal airspace disease medially in the left lower lobe, best seen on the lateral view. The right lung appears clear. No pleural effusion or pneumothorax. Mild degenerative changes in the spine without evidence of acute osseous abnormality. IMPRESSION: Focal left lower lobe airspace disease suspicious for pneumonia. Correlate clinically. Followup PA and lateral chest X-ray is recommended in 4-6 weeks following appropriate therapy to ensure resolution and exclude underlying malignancy. Electronically  Signed   By: Elmon Hagedorn M.D.   On: 01/09/2024 13:46    Procedures Procedures    Medications Ordered in ED Medications  albuterol (VENTOLIN HFA) 108 (90 Base) MCG/ACT inhaler 2 puff (has no administration in time range)  lactated ringers bolus 1,000 mL (has no administration in time range)    ED Course/ Medical Decision Making/ A&P Clinical Course as of 01/09/24 1529  Mon Jan 09, 2024  1516 Stable 61 YOF with a chief complaint of syncope 3rd day of fever/malaise diagnosed with UTI. Syncopized in clinic today. Temp 102 CXR. Labs and likely admit. [CC]    Clinical Course User Index [CC] Onetha Bile, MD                                 Medical Decision Making Problems Addressed: Acute febrile illness: acute illness or injury with systemic symptoms that poses a threat to life or bodily functions Community acquired pneumonia of left lower lobe of lung: acute illness or injury with systemic symptoms Syncope and collapse: acute illness or injury with systemic symptoms that poses a threat to life or bodily functions  Amount and/or Complexity of Data Reviewed Independent Historian: spouse    Details: hx External Data Reviewed: notes. Labs: ordered. Decision-making details documented in ED Course. Radiology: ordered and independent interpretation performed. Decision-making details documented in ED Course. ECG/medicine tests: ordered and independent interpretation performed. Decision-making details documented in ED Course.  Risk Prescription drug management. Decision regarding hospitalization.   Iv ns. Continuous pulse ox and cardiac monitoring. Labs ordered/sent. Imaging ordered.   Differential diagnosis includes  . Dispo decision including potential need for admission considered - will get labs and imaging and reassess.   Reviewed nursing notes and prior charts for additional history. External reports reviewed. Additional history from: spouse.   Cardiac monitor:  sinus rhythm, rate 84  Labs reviewed/interpreted by me - ua neg for uti. Other labs pending.   Xrays reviewed/interpreted by me - ?LLL pna. +recent cough/congestion. Rocephin iv.   1529, labs pending, signed out to Dr Urban Garden, probable admission, check labs, recheck pt, and dispo appropriately.          Final Clinical Impression(s) / ED Diagnoses Final diagnoses:  None    Rx / DC Orders ED Discharge  Orders     None           Guadalupe Lee, MD 01/09/24 [phone]

## 2024-01-12 ENCOUNTER — Encounter (HOSPITAL_COMMUNITY): Payer: Self-pay

## 2024-01-12 ENCOUNTER — Other Ambulatory Visit: Payer: Self-pay

## 2024-01-12 ENCOUNTER — Emergency Department (HOSPITAL_COMMUNITY)

## 2024-01-12 ENCOUNTER — Observation Stay (HOSPITAL_COMMUNITY)
Admission: EM | Admit: 2024-01-12 | Discharge: 2024-01-15 | Disposition: A | Discharge status: Home / Self Care | Attending: Internal Medicine | Admitting: Internal Medicine

## 2024-01-12 DIAGNOSIS — E785 Hyperlipidemia, unspecified: Secondary | ICD-10-CM | POA: Insufficient documentation

## 2024-01-12 DIAGNOSIS — E876 Hypokalemia: Secondary | ICD-10-CM | POA: Insufficient documentation

## 2024-01-12 DIAGNOSIS — I10 Essential (primary) hypertension: Secondary | ICD-10-CM | POA: Diagnosis not present

## 2024-01-12 DIAGNOSIS — Z7982 Long term (current) use of aspirin: Secondary | ICD-10-CM | POA: Diagnosis not present

## 2024-01-12 DIAGNOSIS — E66812 Obesity, class 2: Secondary | ICD-10-CM | POA: Diagnosis not present

## 2024-01-12 DIAGNOSIS — E119 Type 2 diabetes mellitus without complications: Secondary | ICD-10-CM | POA: Insufficient documentation

## 2024-01-12 DIAGNOSIS — R932 Abnormal findings on diagnostic imaging of liver and biliary tract: Secondary | ICD-10-CM | POA: Insufficient documentation

## 2024-01-12 DIAGNOSIS — Z79899 Other long term (current) drug therapy: Secondary | ICD-10-CM | POA: Insufficient documentation

## 2024-01-12 DIAGNOSIS — Z96652 Presence of left artificial knee joint: Secondary | ICD-10-CM | POA: Insufficient documentation

## 2024-01-12 DIAGNOSIS — Z6837 Body mass index (BMI) 37.0-37.9, adult: Secondary | ICD-10-CM | POA: Diagnosis not present

## 2024-01-12 DIAGNOSIS — F32A Depression, unspecified: Secondary | ICD-10-CM | POA: Insufficient documentation

## 2024-01-12 DIAGNOSIS — J189 Pneumonia, unspecified organism: Secondary | ICD-10-CM | POA: Diagnosis not present

## 2024-01-12 DIAGNOSIS — J984 Other disorders of lung: Secondary | ICD-10-CM | POA: Diagnosis not present

## 2024-01-12 DIAGNOSIS — R0602 Shortness of breath: Secondary | ICD-10-CM | POA: Diagnosis not present

## 2024-01-12 DIAGNOSIS — R918 Other nonspecific abnormal finding of lung field: Secondary | ICD-10-CM | POA: Diagnosis not present

## 2024-01-12 LAB — COMPREHENSIVE METABOLIC PANEL WITH GFR
ALT: 26 U/L (ref 0–44)
AST: 26 U/L (ref 15–41)
Albumin: 2.9 g/dL — ABNORMAL LOW (ref 3.5–5.0)
Alkaline Phosphatase: 82 U/L (ref 38–126)
Anion gap: 11 (ref 5–15)
BUN: 14 mg/dL (ref 8–23)
CO2: 27 mmol/L (ref 22–32)
Calcium: 8.7 mg/dL — ABNORMAL LOW (ref 8.9–10.3)
Chloride: 99 mmol/L (ref 98–111)
Creatinine, Ser: 0.77 mg/dL (ref 0.44–1.00)
GFR, Estimated: >60 mL/min (ref >60)
Glucose, Bld: 149 mg/dL — ABNORMAL HIGH (ref 70–99)
Potassium: 2.9 mmol/L — ABNORMAL LOW (ref 3.5–5.1)
Sodium: 137 mmol/L (ref 135–145)
Total Bilirubin: 0.8 mg/dL (ref 0.0–1.2)
Total Protein: 6.7 g/dL (ref 6.5–8.1)

## 2024-01-12 LAB — PHOSPHORUS: Phosphorus: 3.1 mg/dL (ref 2.5–4.6)

## 2024-01-12 LAB — CBC
HCT: 38.9 % (ref 36.0–46.0)
Hemoglobin: 12.2 g/dL (ref 12.0–15.0)
MCH: 29.1 pg (ref 26.0–34.0)
MCHC: 31.4 g/dL (ref 30.0–36.0)
MCV: 92.8 fL (ref 80.0–100.0)
Platelets: 178 10*3/uL (ref 150–400)
RBC: 4.19 MIL/uL (ref 3.87–5.11)
RDW: 13.5 % (ref 11.5–15.5)
WBC: 12.8 10*3/uL — ABNORMAL HIGH (ref 4.0–10.5)
nRBC: 0 % (ref 0.0–0.2)

## 2024-01-12 LAB — GLUCOSE, CAPILLARY: Glucose-Capillary: 156 mg/dL — ABNORMAL HIGH (ref 70–99)

## 2024-01-12 LAB — MAGNESIUM: Magnesium: 1.9 mg/dL (ref 1.7–2.4)

## 2024-01-12 LAB — MRSA NEXT GEN BY PCR, NASAL: MRSA by PCR Next Gen: NOT DETECTED

## 2024-01-12 LAB — I-STAT CG4 LACTIC ACID, ED: Lactic Acid, Venous: 0.8 mmol/L (ref 0.5–1.9)

## 2024-01-12 MED ORDER — ACETAMINOPHEN 325 MG PO TABS
650.0000 mg | ORAL_TABLET | Freq: Four times a day (QID) | ORAL | Status: DC | PRN
  Administered 2024-01-13 – 2024-01-14 (×2): 650 mg via ORAL
  Filled 2024-01-12 (×2): qty 2

## 2024-01-12 MED ORDER — SODIUM CHLORIDE 0.9 % IV SOLN
100.0000 mg | Freq: Once | INTRAVENOUS | Status: AC
  Administered 2024-01-12: 100 mg via INTRAVENOUS
  Filled 2024-01-12: qty 100

## 2024-01-12 MED ORDER — ACETAMINOPHEN 650 MG RE SUPP: 650.0000 mg | Freq: Four times a day (QID) | RECTAL | Status: DC | PRN

## 2024-01-12 MED ORDER — SODIUM CHLORIDE 0.9 % IV SOLN
2.0000 g | Freq: Once | INTRAVENOUS | Status: AC
  Administered 2024-01-12: 2 g via INTRAVENOUS
  Filled 2024-01-12: qty 20

## 2024-01-12 MED ORDER — SODIUM CHLORIDE 0.9 % IV SOLN: INTRAVENOUS | Status: AC

## 2024-01-12 MED ORDER — SODIUM CHLORIDE 0.9 % IV BOLUS
1000.0000 mL | Freq: Once | INTRAVENOUS | Status: AC
Start: 2024-01-12 — End: 2024-01-12
  Administered 2024-01-12: 1000 mL via INTRAVENOUS

## 2024-01-12 MED ORDER — ONDANSETRON HCL 4 MG/2ML IJ SOLN: 4.0000 mg | Freq: Four times a day (QID) | INTRAMUSCULAR | Status: DC | PRN

## 2024-01-12 MED ORDER — ONDANSETRON HCL 4 MG PO TABS: 4.0000 mg | ORAL_TABLET | Freq: Four times a day (QID) | ORAL | Status: DC | PRN

## 2024-01-12 MED ORDER — POTASSIUM CHLORIDE CRYS ER 20 MEQ PO TBCR
40.0000 meq | EXTENDED_RELEASE_TABLET | Freq: Once | ORAL | Status: AC
  Administered 2024-01-12: 40 meq via ORAL
  Filled 2024-01-12: qty 2

## 2024-01-12 MED ORDER — SODIUM CHLORIDE 0.9 % IV SOLN
100.0000 mg | Freq: Two times a day (BID) | INTRAVENOUS | Status: DC
  Administered 2024-01-13 – 2024-01-15 (×5): 100 mg via INTRAVENOUS
  Filled 2024-01-12 (×6): qty 100

## 2024-01-12 MED ORDER — ROSUVASTATIN CALCIUM 5 MG PO TABS
5.0000 mg | ORAL_TABLET | Freq: Every day | ORAL | Status: DC
  Administered 2024-01-13 – 2024-01-15 (×3): 5 mg via ORAL
  Filled 2024-01-12 (×3): qty 1

## 2024-01-12 MED ORDER — LOSARTAN POTASSIUM 50 MG PO TABS
25.0000 mg | ORAL_TABLET | Freq: Every day | ORAL | Status: DC
  Administered 2024-01-13 – 2024-01-15 (×3): 25 mg via ORAL
  Filled 2024-01-12 (×3): qty 1

## 2024-01-12 MED ORDER — IPRATROPIUM-ALBUTEROL 0.5-2.5 (3) MG/3ML IN SOLN: 3.0000 mL | Freq: Four times a day (QID) | RESPIRATORY_TRACT | Status: DC | PRN

## 2024-01-12 MED ORDER — SENNOSIDES-DOCUSATE SODIUM 8.6-50 MG PO TABS: 1.0000 | ORAL_TABLET | Freq: Every evening | ORAL | Status: DC | PRN

## 2024-01-12 MED ORDER — INSULIN ASPART 100 UNIT/ML IJ SOLN: 0.0000 [IU] | Freq: Every day | INTRAMUSCULAR | Status: DC

## 2024-01-12 MED ORDER — ENOXAPARIN SODIUM 40 MG/0.4ML IJ SOSY
40.0000 mg | PREFILLED_SYRINGE | Freq: Every day | INTRAMUSCULAR | Status: DC
  Administered 2024-01-12 – 2024-01-14 (×3): 40 mg via SUBCUTANEOUS
  Filled 2024-01-12 (×3): qty 0.4

## 2024-01-12 MED ORDER — INSULIN ASPART 100 UNIT/ML IJ SOLN
0.0000 [IU] | Freq: Three times a day (TID) | INTRAMUSCULAR | Status: DC
  Administered 2024-01-13: 2 [IU] via SUBCUTANEOUS
  Administered 2024-01-14 (×2): 3 [IU] via SUBCUTANEOUS

## 2024-01-12 MED ORDER — SERTRALINE HCL 50 MG PO TABS
50.0000 mg | ORAL_TABLET | Freq: Every day | ORAL | Status: DC
  Administered 2024-01-13 – 2024-01-15 (×3): 50 mg via ORAL
  Filled 2024-01-12 (×3): qty 1

## 2024-01-12 MED ORDER — SODIUM CHLORIDE 0.9 % IV SOLN
2.0000 g | INTRAVENOUS | Status: DC
  Administered 2024-01-13 – 2024-01-14 (×2): 2 g via INTRAVENOUS
  Filled 2024-01-12 (×2): qty 20

## 2024-01-12 NOTE — ED Notes (Signed)
 Patient placed on 2L supplemental oxygen for saturation 90-91%. Patient's o2 sat now 94-95%.

## 2024-01-12 NOTE — H&P (Signed)
 History and Physical  Penny Dawson XBM:[id number] DOB: 1950-11-25 DOA: 01/12/2024  PCP: Lanae Pinal, MD   Chief Complaint: Fevers, chills and SOB  HPI: Penny Dawson is a 73 y.o. female with medical history significant for Graves' disease, HTN, HLD, obesity and depression who presents to the ED for evaluation of shortness of breath and fevers.  Patient reports that last Thursday, she started having fevers, chills and headaches with frequent urination.  She was evaluated by her PCP on Friday and placed on Keflex for UTI.  She started having some dry cough over the weekend and her systemic symptoms did not improve while on the Keflex. She presented to her PCPs office on Monday and had a syncope episode.  She was advised to present today ED for further evaluation.  Patient was evaluated at [facility] on 4/28 and found to have left lower lobe pneumonia.  Symptoms improved while in the ED and patient elected for outpatient management instead of inpatient treatment.  She was discharged home on cefpodoxime  with instructions to follow-up with her PCP.  Reports she did not feel any better and continued to have persistent chills and fevers of up to 102.4 over the last 2 days.  She has had associated fatigue, decreased appetite, decreased p.o. intake and occasional chest discomfort with the ongoing cough.  She presented to the PCP again today who advised patient to return to the ED for inpatient treatment.  ED Course: Initial vitals shows temp 99.2, RR 31, HR 88, BP 152/64, SpO2 95% on room air.  Labs significant for white count of 12.8, Hgb 12.2, sodium 137, K+ 2.9, creatinine 0.77, albumin 2.9, lactic acid 0.8, mag 1.9, Phos 3.1. EKG shows sinus rhythm with occasional PVCs.  Repeat chest x-ray shows increased density of the airspace consolidation in the posterior left lower lobe.  Patient received IV Rocephin , IV doxycycline  and KCl 40 mEq x 1.  TRH was consulted for admission.   Review of Systems: Please  see HPI for pertinent positives and negatives. A complete 10 system review of systems are otherwise negative.  Past Medical History:  Diagnosis Date   Depression    Elevated cholesterol    Graves disease    Hypertension    Melanoma (HCC) 11/17/2017   IN SITU- LEFT OUTER DELTOID- TX EXCISION   Obesity    Past Surgical History:  Procedure Laterality Date   CHOLECYSTECTOMY     TOTAL KNEE ARTHROPLASTY     Left   Social History:  reports that she has never smoked. She has never used smokeless tobacco. She reports current alcohol use. She reports that she does not use drugs.  Allergies  Allergen Reactions   Macrolides And Ketolides Hives   Clindamycin/Lincomycin Rash    Also, had a fever with clindamycin. Mycin drugs per patient    Family History  Problem Relation Age of Onset   Diabetes Father    Heart disease Father    Heart attack Father 54   Stroke Father      Prior to Admission medications   Medication Sig Start Date End Date Taking? Authorizing Provider  aspirin 81 MG tablet Take 81 mg by mouth daily.    [provider]  Biotin 5 MG CAPS Take 1 capsule by mouth daily.    [provider]  cefpodoxime  (VANTIN ) 100 MG tablet Take 1 tablet (100 mg total) by mouth 2 (two) times daily for 5 days. 01/09/24 01/14/24  Onetha Bile, MD  empagliflozin (JARDIANCE) 10 MG  TABS tablet 1 tablet 06/29/21   [provider]  ibuprofen (ADVIL,MOTRIN) 200 MG tablet Take 200 mg by mouth every 6 (six) hours as needed.    [provider]  loratadine (CLARITIN) 10 MG tablet Take 10 mg by mouth daily.    [provider]  Multiple Vitamins-Minerals (MULTIVITAMIN & MINERAL PO) Take 1 capsule by mouth daily.    [provider]  sertraline  (ZOLOFT ) 100 MG tablet Take 50 mg by mouth daily.  01/30/15   [provider]  triamterene-hydrochlorothiazide (DYAZIDE) 37.5-25 MG per capsule Take 1 capsule by mouth daily.    [provider]    Physical Exam: BP (!) 149/61 (BP Location: Left Arm)   Pulse 97   Temp 99.6 F (37.6 C) (Oral)   Resp (!) 33   Ht 5\' 5"  (1.651 m)   Wt 102.1 kg   SpO2 90% Comment: pulse ox not working in room; O2 checked with dinamap  BMI 37.44 kg/m  General: Pleasant, sick-appearing elderly woman laying in bed. No acute distress. HEENT: [place]/AT. Anicteric sclera.  Dry mucous membrane. CV: RRR. No murmurs, rubs, or gallops. No LE edema Pulmonary: Tachypneic. Lungs CTAB.  Rhonchi in the LLL. No wheezing or rales. Decreased air movement throughout. Abdominal: Soft, nontender, nondistended. Normal bowel sounds. Extremities: Palpable radial and DP pulses. Normal ROM. Skin: Warm and dry. No obvious rash or lesions. Neuro: A&Ox3. Moves all extremities. Normal sensation to light touch. No focal deficit. Psych: Normal mood and affect          Labs on Admission:  Basic Metabolic Panel: Recent Labs  Lab 01/09/24 1547 01/12/24 1439 01/12/24 1746  NA 138 137  --   K 3.3* 2.9*  --   CL 101 99  --   CO2 26 27  --   GLUCOSE 131* 149*  --   BUN 12 14  --   CREATININE 0.85 0.77  --   CALCIUM  9.4 8.7*  --   MG  --   --  1.9  PHOS  --   --  3.1   Liver Function Tests: Recent Labs  Lab 01/09/24 1547 01/12/24 1439  AST 17 26  ALT 16 26  ALKPHOS 84 82  BILITOT 0.8 0.8  PROT 6.4* 6.7  ALBUMIN 3.8 2.9*   No results for input(s): "LIPASE", "AMYLASE" in the last 168 hours. No results for input(s): "AMMONIA" in the last 168 hours. CBC: Recent Labs  Lab 01/09/24 1547 01/12/24 1439  WBC 12.1* 12.8*  HGB 11.6* 12.2  HCT 33.6* 38.9  MCV 87.5 92.8  PLT 152 178   Cardiac Enzymes: No results for input(s): "CKTOTAL", "CKMB", "CKMBINDEX", "TROPONINI" in the last 168 hours. BNP (last 3 results) No results for input(s): "BNP" in the last 8760 hours.  ProBNP (last 3 results) No results for input(s): "PROBNP" in the last 8760 hours.  CBG: No results for input(s): "GLUCAP" in the last 168  hours.  Radiological Exams on Admission: DG Chest 2 View Result Date: 01/12/2024 CLINICAL DATA:  SOB EXAM: CHEST - 2 VIEW COMPARISON:  January 09, 2024 FINDINGS: More dense airspace consolidation within the posterior left lower lobe. No pneumothorax or pleural effusion. No cardiomegaly. No acute fracture or destructive lesion. Multilevel degenerative disc disease of the spine. IMPRESSION: Increased density of the airspace consolidation in the posterior left lower lobe. No parapneumonic effusion. A follow-up chest radiograph in 6-12 weeks is recommended to document resolution once treatment is complete. Electronically Signed   By:  Rance Burrows M.D.   On: 01/12/2024 17:05   Assessment/Plan SILENA CORTEZ is a 73 y.o. female with medical history significant for Graves' disease, HTN, HLD, obesity and depression who presents to the ED for evaluation of shortness of breath and admitted for community acquired pneumonia.  # Community-acquired pneumonia -Failed outpatient therapy after diagnosis of pneumonia 3 days ago -Admit to MedSurg -Continue IV Rocephin  and doxycycline  -Follow-up sputum culture -Check MRSA screen, full RVP, procalcitonin, urinary Legionella and strep pneumo -Trend CBC, fever curve -Incentive spirometer, flutter valve -Supplemental O2 as needed  # Hypokalemia -K+ 2.9 on admission -Normal mag and Phos -S/p KCl 40 mEq x1 in the ED -Give additional KCl 40 mcg x 1 -Follow-up morning BMP  # HTN -BP elevated with SBP in the 140s to 160s -Continue losartan   # T2DM -No documented A1c on file -Blood glucose of 149 on CMP -SSI with CBG monitoring -Follow-up A1c  # Hx of Graves' disease -Stable  # Depression -Continue sertraline   # HLD -Continue rosuvastatin   # Class II obesity Body mass index is 37.44 kg/m. Filed Weights   01/12/24 1434  Weight: 102.1 kg  -Follow-up with PCP for nutrition and and weight loss counseling in the outpatient  DVT prophylaxis:  Lovenox      Code Status: Full Code  Consults called: None  Family Communication: No family at bedside  Severity of Illness: The appropriate patient status for this patient is OBSERVATION. Observation status is judged to be reasonable and necessary in order to provide the required intensity of service to ensure the patient's safety. The patient's presenting symptoms, physical exam findings, and initial radiographic and laboratory data in the context of their medical condition is felt to place them at decreased risk for further clinical deterioration. Furthermore, it is anticipated that the patient will be medically stable for discharge from the hospital within 2 midnights of admission.   Level of care: Med-Surg   This record has been created using Conservation officer, historic buildings. Errors have been sought and corrected, but may not always be located. Such creation errors do not reflect on the standard of care.   Vita Grip, MD 01/12/2024, 7:40 PM [facility] Pager: [phone] Isaiah 41:10   If 7PM-7AM, please contact night-coverage [email or web address] Password [email or web address]

## 2024-01-12 NOTE — ED Notes (Signed)
 This RN spoke to [employer] teacher on [employer]; states RN is ready to receive patient. Informed NS patient will be en route to inpatient unit.

## 2024-01-12 NOTE — ED Triage Notes (Signed)
 Pt c/o SOB. Was dx w/pneumonia at [facility] several days ago. IP tx was recommended. Pt went home with abx.  Pt feels minor improvement in symptoms.

## 2024-01-12 NOTE — ED Provider Notes (Signed)
 [facility] Provider Note   CSN: [id number] Arrival date & time: 01/12/24  1421     History Chief Complaint  Patient presents with   Shortness of Breath     Shortness of Breath  Penny Dawson is a 73 y.o. female presenting for increasing shortness of breath for the last several days.  She was originally seen over at [facility] on 28 April for similar symptoms.  At that time she was managed with outpatient therapy with oral cefpodoxime .  Since that time she states there is been no improvement in her symptoms in fact her symptoms have worsened and she has a more persistent nonproductive cough and shortness of breath.  She also endorses increased fatigue, decreased appetite.  She also states that she has a persistent fever despite use of Tylenol  regularly.  She also endorses occasional pleuritic chest pain to the left lower chest.  Notable previous medical history is of hypertension, depression, Graves' disease, and hyperlipidemia.  Notable medications are empagliflozin, loratadine, sertraline , and triamterene hydrochlorothiazide.  Also taking noted cefpodoxime  daily as prescribed.   Patient's recorded medical, surgical, social, medication list and allergies were reviewed in the Snapshot window as part of the initial history.   Review of Systems   Review of Systems  Respiratory:  Positive for shortness of breath.     Physical Exam Updated Vital Signs BP (!) 154/69   Pulse 85   Temp 99.2 F (37.3 C) (Oral)   Resp (!) 25   Ht 5\' 5"  (1.651 m)   Wt 102.1 kg   SpO2 93%   BMI 37.44 kg/m  Physical Exam Vitals and nursing note reviewed.  Constitutional:      General: She is not in acute distress.    Appearance: Normal appearance.  HENT:     Head: Normocephalic and atraumatic.     Mouth/Throat:     Mouth: Mucous membranes are moist.     Pharynx: Oropharynx is clear.  Eyes:     Extraocular Movements: Extraocular movements intact.      Conjunctiva/sclera: Conjunctivae normal.     Pupils: Pupils are equal, round, and reactive to light.  Cardiovascular:     Rate and Rhythm: Normal rate and regular rhythm.     Pulses: Normal pulses.     Heart sounds: Normal heart sounds. No murmur heard.    No friction rub. No gallop.  Pulmonary:     Effort: Pulmonary effort is normal.     Breath sounds: Normal breath sounds. No decreased breath sounds, wheezing, rhonchi or rales.  Chest:     Chest wall: No crepitus. There is no dullness to percussion.  Abdominal:     General: Abdomen is flat. Bowel sounds are normal.     Palpations: Abdomen is soft.  Musculoskeletal:        General: Normal range of motion.     Cervical back: Normal range of motion and neck supple.     Right lower leg: No edema.     Left lower leg: No edema.  Skin:    General: Skin is warm and dry.     Capillary Refill: Capillary refill takes less than 2 seconds.  Neurological:     General: No focal deficit present.     Mental Status: She is alert. Mental status is at baseline.  Psychiatric:        Mood and Affect: Mood normal.      ED Course/ Medical Decision Making/ A&P Clinical  Course as of 01/12/24 1632  Thu Jan 12, 2024  1548 Potassium(!): 2.9 Noted decreased from previous result 3 days ago at 3.3.  Will manage with oral replacement. [JG]    Clinical Course User Index [JG] Penny Nordmann, PA    Procedures Procedures   Medications Ordered in ED Medications  cefTRIAXone  (ROCEPHIN ) 2 g in sodium chloride  0.9 % 100 mL IVPB (2 g Intravenous New Bag/Given 01/12/24 1611)  doxycycline  (VIBRAMYCIN ) 100 mg in sodium chloride  0.9 % 250 mL IVPB (has no administration in time range)  potassium chloride  SA (KLOR-CON  M) CR tablet 40 mEq (40 mEq Oral Given 01/12/24 1605)    Medical Decision Making:   Penny Dawson is a 73 y.o. female who presented to the ED today with increased cough and shortness of breath detailed above.    Additional history  discussed with patient's family/caregivers.  Patient placed on continuous vitals and telemetry monitoring while in ED which was reviewed periodically.  Complete initial physical exam performed, notably the patient  was in no apparent distress and stable.  Lung sounds are clear and equal bilaterally with no wheezes, rales, or rhonchi.   Posterior pharynx is unremarkable.  Turgor is normal.  Previous imaging findings suspicious for community-acquired pneumonia.  Reviewed and confirmed nursing documentation for past medical history, family history, social history.    Initial Assessment:   With the patient's presentation of increased shortness of breath,, most likely diagnosis is pneumonia. Other diagnoses were considered including (but not limited to) PE, pneumothorax, acute bronchitis. These are considered less likely due to history of present illness and physical exam findings.     Initial Plan:  Continuous cardiac and pulse oximetry monitoring Screening labs including CBC and Metabolic panel to evaluate for infectious or metabolic etiology of disease.  CXR to evaluate for structural/infectious intrathoracic pathology.  EKG to evaluate for cardiac pathology Objective evaluation as below reviewed   Initial Study Results:   Laboratory  All laboratory results reviewed without evidence of clinically relevant pathology.   Exceptions include: Leukocytosis of 12.8, potassium is 2.9.  Calcium  is reported at 8.7, albumin is 2.9 therefore is within normal range when corrected.  EKG EKG was reviewed independently. Rate, rhythm, axis, intervals all examined and without medically relevant abnormality. ST segments without concerns for elevations.    Radiology:  All images reviewed independently. Agree with radiology report at this time.   DG Chest 2 View Result Date: 01/09/2024 CLINICAL DATA:  Shortness of breath.  Syncopal episode. EXAM: CHEST - 2 VIEW COMPARISON:  Radiographs 11/12/2009 and  11/03/2009. FINDINGS: The heart size and mediastinal contours are stable. There is focal airspace disease medially in the left lower lobe, best seen on the lateral view. The right lung appears clear. No pleural effusion or pneumothorax. Mild degenerative changes in the spine without evidence of acute osseous abnormality. IMPRESSION: Focal left lower lobe airspace disease suspicious for pneumonia. Correlate clinically. Followup PA and lateral chest X-ray is recommended in 4-6 weeks following appropriate therapy to ensure resolution and exclude underlying malignancy. Electronically Signed   By: Elmon Hagedorn M.D.   On: 01/09/2024 13:46      Consults: Case discussed with [facility] hospitalist, will accept .   Reassessment and Plan:   Secondary to failed outpatient therapy, will seek admission for inpatient IV antibiotics.  I began initial IV antibiotic therapy with ceftriaxone  2 g along with doxycycline  due to her macrolide sensitivity.    Clinical Impression:  1. Community acquired pneumonia, unspecified  laterality      Admit   Final Clinical Impression(s) / ED Diagnoses Final diagnoses:  Community acquired pneumonia, unspecified laterality    Rx / DC Orders ED Discharge Orders     None         Penny Dawson, [place] 01/12/24 1632    Dorenda Gandy, MD 01/14/24 [phone]

## 2024-01-13 DIAGNOSIS — J189 Pneumonia, unspecified organism: Secondary | ICD-10-CM | POA: Diagnosis not present

## 2024-01-13 LAB — PROCALCITONIN: Procalcitonin: 0.1 ng/mL

## 2024-01-13 LAB — RESPIRATORY PANEL BY PCR

## 2024-01-13 LAB — CBC
HCT: 35.4 % — ABNORMAL LOW (ref 36.0–46.0)
Hemoglobin: 11.1 g/dL — ABNORMAL LOW (ref 12.0–15.0)
MCH: 29.3 pg (ref 26.0–34.0)
MCHC: 31.4 g/dL (ref 30.0–36.0)
MCV: 93.4 fL (ref 80.0–100.0)
Platelets: 196 10*3/uL (ref 150–400)
RBC: 3.79 MIL/uL — ABNORMAL LOW (ref 3.87–5.11)
RDW: 13.6 % (ref 11.5–15.5)
WBC: 11 10*3/uL — ABNORMAL HIGH (ref 4.0–10.5)
nRBC: 0 % (ref 0.0–0.2)

## 2024-01-13 LAB — STREP PNEUMONIAE URINARY ANTIGEN: Strep Pneumo Urinary Antigen: NEGATIVE

## 2024-01-13 LAB — BASIC METABOLIC PANEL WITH GFR
Anion gap: 9 (ref 5–15)
BUN: 11 mg/dL (ref 8–23)
CO2: 25 mmol/L (ref 22–32)
Calcium: 8.3 mg/dL — ABNORMAL LOW (ref 8.9–10.3)
Chloride: 105 mmol/L (ref 98–111)
Creatinine, Ser: 0.65 mg/dL (ref 0.44–1.00)
GFR, Estimated: >60 mL/min (ref >60)
Glucose, Bld: 124 mg/dL — ABNORMAL HIGH (ref 70–99)
Potassium: 3.7 mmol/L (ref 3.5–5.1)
Sodium: 139 mmol/L (ref 135–145)

## 2024-01-13 LAB — GLUCOSE, CAPILLARY
Glucose-Capillary: 122 mg/dL — ABNORMAL HIGH (ref 70–99)
Glucose-Capillary: 86 mg/dL (ref 70–99)
Glucose-Capillary: 111 mg/dL — ABNORMAL HIGH (ref 70–99)
Glucose-Capillary: 130 mg/dL — ABNORMAL HIGH (ref 70–99)

## 2024-01-13 LAB — HEMOGLOBIN A1C
Hgb A1c MFr Bld: 5.2 % (ref 4.8–5.6)
Mean Plasma Glucose: 102.54 mg/dL

## 2024-01-13 NOTE — Progress Notes (Signed)
 Mobility Specialist - Progress Note   01/13/24 1317  Oxygen Therapy  SpO2 96 %  O2 Device Nasal Cannula  O2 Flow Rate (L/min) 2 L/min  Patient Activity (if Appropriate) Ambulating  Mobility  Activity Ambulated independently in hallway  Level of Assistance Independent  Assistive Device None  Distance Ambulated (ft) 275 ft  Activity Response Tolerated well  Mobility Referral Yes  Mobility visit 1 Mobility  Mobility Specialist Start Time (ACUTE ONLY) 1300  Mobility Specialist Stop Time (ACUTE ONLY) 1317  Mobility Specialist Time Calculation (min) (ACUTE ONLY) 17 min   Pt received in bed and agreeable to mobility. No complaints during session. Pt to recliner after session with all needs met.    [facility]

## 2024-01-13 NOTE — Progress Notes (Signed)
   01/13/24 1136  TOC Brief Assessment  Insurance and Status Reviewed  Patient has primary care physician Yes  Home environment has been reviewed single family home  Prior level of function: independent  Prior/Current Home Services No current home services  Social Drivers of Health Review SDOH reviewed no interventions necessary  Readmission risk has been reviewed Yes  Transition of care needs no transition of care needs at this time    [place], MSW, LCSW 01/13/2024 11:37 AM

## 2024-01-13 NOTE — Care Management Obs Status (Signed)
 MEDICARE OBSERVATION STATUS NOTIFICATION   Patient Details  Name: ESHIKA RIGALI MRN: [id number] Date of Birth: 09/25/1950   Medicare Observation Status Notification Given: Yes    Hansika Leaming A Reese Stockman, LCSW 01/13/2024, 3:01 PM

## 2024-01-13 NOTE — Plan of Care (Signed)

## 2024-01-13 NOTE — Progress Notes (Signed)
 PROGRESS NOTE  P[facility]  DOB: 1951-05-25  PCP: Lanae Pinal, MD ZOX:[id number]  DOA: 01/12/2024  LOS: 0 days  Hospital Day: 2  Brief narrative: Penny Dawson is a 73 y.o. female with PMH significant for obesity, HTN, HLD, Graves' disease, depression 5/1, patient presented to the ED with complaint of shortness of breath, fever, chills, headache, frequent urination for 1 week.   4/24, she started having fevers, chills and headaches with frequent urination. 4/25, PCP started her on Keflex despite which her symptoms did not improve and she also developed dry cough. 4/28, went to PCP for follow-up.  Had a syncopal episode.  She was sent to ED at [facility].  X-ray showed left lower lobe pneumonia.  Per patient, she was discharged home on oral cefpodoxime .  At home, she continued to have symptoms and also developed a fever of 102.4.  Also had associated fatigue, poor oral intake and hence advised by her PCP to return to ED on 5/1  In the ED, patient had temperature of 99.2, hemodynamically stable Labs showed WC count of 12.8, potassium low at 2.9, lactic acid 0.8 Chest x-ray showed increased density of airspace consolidation in the posterior left lower lobe. Patient was started on IV Rocephin  Admitted to [facility]  Subjective: Patient was seen and examined this morning.  Pleasant elderly Caucasian female.  Sitting up in bed.  Husband at bedside. Feels much better.  Was able to eat good breakfast this morning. Not on supplemental oxygen.  Has mild dry cough.  Urinary symptoms improving as well. Chart reviewed. Afebrile, hemodynamically stable  Assessment and plan: Community-acquired pneumonia Failed outpatient therapy after diagnosis of pneumonia 3 days ago Chest x-ray on admission showed increased density of airspace consolidation in posterior left lower lobe WBC count was elevated, procalcitonin mildly elevated Started on IV Rocephin  on admission Continue to monitor  symptoms Continue bronchodilators, incentive spirometry, flutter valve Recent Labs  Lab 01/09/24 1547 01/12/24 1439 01/12/24 1525 01/13/24 0508  WBC 12.1* 12.8*  --  11.0*  LATICACIDVEN 0.9  --  0.8  --   PROCALCITON  --   --   --  0.10   Hypokalemia Potassium level improved with replacement. Recent Labs  Lab 01/09/24 1547 01/12/24 1439 01/12/24 1746 01/13/24 0508  K 3.3* 2.9*  --  3.7  MG  --   --  1.9  --   PHOS  --   --  3.1  --    Type 2 diabetes mellitus A1c pending PTA meds-none Continue SSI/Accu-Cheks No results found for: "HGBA1C" Recent Labs  Lab 01/12/24 2158 01/13/24 0742  GLUCAP 156* 122*   HTN Continue losartan   Hyperlipidemia Crestor    H/o Graves' disease Currently not on any medicine for hypothyroidism or hypothyroidism   Depression Continue sertraline    Obesity 2 Body mass index is 37.44 kg/m. Patient has been advised to make an attempt to improve diet and exercise patterns to aid in weight loss.   Mobility: Encourage ambulation.  Independent at baseline  Goals of care   Code Status: Full Code     DVT prophylaxis:  enoxaparin  (LOVENOX ) injection 40 mg Start: 01/12/24 2200   Antimicrobials: IV Rocephin  Fluid: None Consultants: None Family Communication: Husband at bedside  Status: Observation Level of care:  Med-Surg   Patient is from: Home Needs to continue in-hospital care: Needs IV antibiotics and inpatient monitoring for at least 24 more hours Anticipated d/c to: Hopefully home in 1 to 2 days    Diet:  Diet Order             Diet Carb Modified Fluid consistency: Thin; Room service appropriate? Yes  Diet effective now                   Scheduled Meds:  enoxaparin  (LOVENOX ) injection  40 mg Subcutaneous QHS   insulin  aspart  0-15 Units Subcutaneous TID WC   insulin  aspart  0-5 Units Subcutaneous QHS   losartan   25 mg Oral Daily   rosuvastatin   5 mg Oral Daily   sertraline   50 mg Oral Daily    PRN  meds: acetaminophen  **OR** acetaminophen , ipratropium-albuterol , ondansetron  **OR** ondansetron  (ZOFRAN ) IV, senna-docusate   Infusions:   cefTRIAXone  (ROCEPHIN )  IV     doxycycline  (VIBRAMYCIN ) IV 100 mg (01/13/24 0342)    Antimicrobials: Anti-infectives (From admission, onward)    Start     Dose/Rate Route Frequency Ordered Stop   01/13/24 1600  cefTRIAXone  (ROCEPHIN ) 2 g in sodium chloride  0.9 % 100 mL IVPB        2 g 200 mL/hr over 30 Minutes Intravenous Every 24 hours 01/12/24 2018 01/17/24 1559   01/13/24 0400  doxycycline  (VIBRAMYCIN ) 100 mg in sodium chloride  0.9 % 250 mL IVPB        100 mg 125 mL/hr over 120 Minutes Intravenous Every 12 hours 01/12/24 2018 01/18/24 0359   01/12/24 1615  cefTRIAXone  (ROCEPHIN ) 2 g in sodium chloride  0.9 % 100 mL IVPB        2 g 200 mL/hr over 30 Minutes Intravenous  Once 01/12/24 1607 01/12/24 1641   01/12/24 1615  doxycycline  (VIBRAMYCIN ) 100 mg in sodium chloride  0.9 % 250 mL IVPB        100 mg 125 mL/hr over 120 Minutes Intravenous  Once 01/12/24 1607 01/12/24 1927       Objective: Vitals:   01/13/24 0405 01/13/24 0748  BP: 139/73 (!) 156/74  Pulse: 88 83  Resp: 18 17  Temp: 98.5 F (36.9 C) 98.5 F (36.9 C)  SpO2: 91% 94%    Intake/Output Summary (Last 24 hours) at 01/13/2024 1019 Last data filed at 01/13/2024 0754 Gross per 24 hour  Intake 2668.26 ml  Output --  Net 2668.26 ml   Filed Weights   01/12/24 1434  Weight: 102.1 kg   Weight change:  Body mass index is 37.44 kg/m.   Physical Exam: General exam: Pleasant, elderly Caucasian female.  Not in pain Skin: No rashes, lesions or ulcers. HEENT: Atraumatic, normocephalic, no obvious bleeding Lungs: Clear to auscultation bilaterally, dry cough CVS: S1, S2, no murmur,   GI/Abd: Soft, nontender, nondistended, bowel sound present,   CNS: Alert, awake, oriented x 3 Psychiatry: Mood appropriate,  Extremities: No pedal edema, no calf tenderness,   Data Review: I have  personally reviewed the laboratory data and studies available.  F/u labs ordered Unresulted Labs (From admission, onward)     Start     Ordered   01/14/24 0500  Basic metabolic panel with GFR  Tomorrow morning,   R        01/13/24 1019   01/14/24 0500  CBC with Differential/Platelet  Tomorrow morning,   R        01/13/24 1019   01/13/24 0500  Hemoglobin A1c  Tomorrow morning,   R       Comments: To assess prior glycemic control    01/12/24 2015   01/12/24 1732  Legionella Pneumophila Serogp 1 Ur Ag  (COPD / Pneumonia /  Cellulitis / Lower Extremity Wound (Diabetic Foot Infection))  Once,   R        01/12/24 1731   01/12/24 1732  Expectorated Sputum Assessment w Gram Stain, Rflx to Resp Cult  (COPD / Pneumonia / Cellulitis / Lower Extremity Wound (Diabetic Foot Infection))  Once,   R        01/12/24 1731            Signed, Hoyt Macleod, MD [facility] 01/13/2024

## 2024-01-14 ENCOUNTER — Observation Stay (HOSPITAL_COMMUNITY)

## 2024-01-14 DIAGNOSIS — R0600 Dyspnea, unspecified: Secondary | ICD-10-CM | POA: Diagnosis not present

## 2024-01-14 DIAGNOSIS — J90 Pleural effusion, not elsewhere classified: Secondary | ICD-10-CM | POA: Diagnosis not present

## 2024-01-14 DIAGNOSIS — R918 Other nonspecific abnormal finding of lung field: Secondary | ICD-10-CM | POA: Diagnosis not present

## 2024-01-14 DIAGNOSIS — R161 Splenomegaly, not elsewhere classified: Secondary | ICD-10-CM | POA: Diagnosis not present

## 2024-01-14 DIAGNOSIS — J189 Pneumonia, unspecified organism: Secondary | ICD-10-CM | POA: Diagnosis not present

## 2024-01-14 LAB — CBC WITH DIFFERENTIAL/PLATELET
Abs Immature Granulocytes: 0.45 10*3/uL — ABNORMAL HIGH (ref 0.00–0.07)
Basophils Absolute: 0.1 10*3/uL (ref 0.0–0.1)
Basophils Relative: 1 %
Eosinophils Absolute: 0.2 10*3/uL (ref 0.0–0.5)
Eosinophils Relative: 2 %
HCT: 37.5 % (ref 36.0–46.0)
Hemoglobin: 11.4 g/dL — ABNORMAL LOW (ref 12.0–15.0)
Immature Granulocytes: 4 %
Lymphocytes Relative: 10 %
Lymphs Abs: 1.1 10*3/uL (ref 0.7–4.0)
MCH: 29.1 pg (ref 26.0–34.0)
MCHC: 30.4 g/dL (ref 30.0–36.0)
MCV: 95.7 fL (ref 80.0–100.0)
Monocytes Absolute: 0.6 10*3/uL (ref 0.1–1.0)
Monocytes Relative: 6 %
Neutro Abs: 8.8 10*3/uL — ABNORMAL HIGH (ref 1.7–7.7)
Neutrophils Relative %: 77 %
Platelets: 235 10*3/uL (ref 150–400)
RBC: 3.92 MIL/uL (ref 3.87–5.11)
RDW: 13.6 % (ref 11.5–15.5)
WBC: 11.3 10*3/uL — ABNORMAL HIGH (ref 4.0–10.5)
nRBC: 0 % (ref 0.0–0.2)

## 2024-01-14 LAB — CULTURE, BLOOD (ROUTINE X 2)
Culture: NO GROWTH
Culture: NO GROWTH
Special Requests: ADEQUATE
Special Requests: ADEQUATE

## 2024-01-14 LAB — BASIC METABOLIC PANEL WITH GFR
Anion gap: 13 (ref 5–15)
BUN: 12 mg/dL (ref 8–23)
CO2: 22 mmol/L (ref 22–32)
Calcium: 8.5 mg/dL — ABNORMAL LOW (ref 8.9–10.3)
Chloride: 103 mmol/L (ref 98–111)
Creatinine, Ser: 0.71 mg/dL (ref 0.44–1.00)
GFR, Estimated: >60 mL/min (ref >60)
Glucose, Bld: 187 mg/dL — ABNORMAL HIGH (ref 70–99)
Potassium: 3.7 mmol/L (ref 3.5–5.1)
Sodium: 138 mmol/L (ref 135–145)

## 2024-01-14 LAB — GLUCOSE, CAPILLARY
Glucose-Capillary: 171 mg/dL — ABNORMAL HIGH (ref 70–99)
Glucose-Capillary: 85 mg/dL (ref 70–99)
Glucose-Capillary: 153 mg/dL — ABNORMAL HIGH (ref 70–99)
Glucose-Capillary: 120 mg/dL — ABNORMAL HIGH (ref 70–99)

## 2024-01-14 MED ORDER — IOHEXOL 300 MG/ML  SOLN
75.0000 mL | Freq: Once | INTRAMUSCULAR | Status: AC | PRN
  Administered 2024-01-14: 75 mL via INTRAVENOUS

## 2024-01-14 NOTE — Plan of Care (Signed)
  Problem: Education: Goal: Knowledge of General Education information will improve Description: Including pain rating scale, medication(s)/side effects and non-pharmacologic comfort measures Outcome: Progressing   Problem: Clinical Measurements: Goal: Will remain free from infection Outcome: Progressing   Problem: Nutrition: Goal: Adequate nutrition will be maintained Outcome: Progressing   Problem: Elimination: Goal: Will not experience complications related to bowel motility Outcome: Progressing Goal: Will not experience complications related to urinary retention Outcome: Progressing   Problem: Pain Managment: Goal: General experience of comfort will improve and/or be controlled Outcome: Progressing   Problem: Safety: Goal: Ability to remain free from injury will improve Outcome: Progressing   Problem: Skin Integrity: Goal: Risk for impaired skin integrity will decrease Outcome: Progressing

## 2024-01-14 NOTE — Progress Notes (Signed)
 PROGRESS NOTE  P[facility]  DOB: October 06, 1950  PCP: Penny Pinal, MD VWU:[id number]  DOA: 01/12/2024  LOS: 0 days  Hospital Day: 3  Brief narrative: Penny Dawson is a 73 y.o. female with PMH significant for obesity, HTN, HLD, Graves' disease, depression 5/1, patient presented to the ED with complaint of shortness of breath, fever, chills, headache, frequent urination for 1 week.   4/24, she started having fevers, chills and headaches with frequent urination. 4/25, PCP started her on Keflex despite which her symptoms did not improve and she also developed dry cough. 4/28, went to PCP for follow-up.  Had a syncopal episode.  She was sent to ED at [facility].  X-ray showed left lower lobe pneumonia.  Per patient, she was discharged home on oral cefpodoxime .  At home, she continued to have symptoms and also developed a fever of 102.4.  Also had associated fatigue, poor oral intake and hence advised by her PCP to return to ED on 5/1  In the ED, patient had temperature of 99.2, hemodynamically stable Labs showed WC count of 12.8, potassium low at 2.9, lactic acid 0.8 Chest x-ray showed increased density of airspace consolidation in the posterior left lower lobe. Patient was started on IV Rocephin  Admitted to [facility]  Subjective: Patient was seen and examined this morning.   Propped up in bed.  On supplemental oxygen.   Husband at bedside.   Patient felt good yesterday morning but in the afternoon, she developed chest pain, discomfort.  Does not feel good today  Assessment and plan: Community-acquired pneumonia Failed outpatient therapy after diagnosis of pneumonia 3 days ago Chest x-ray on admission showed increased density of airspace consolidation in posterior left lower lobe WBC count was elevated, procalcitonin mildly elevated Started on IV Rocephin  on admission Patient does not feel good today.  On chart review, it is concerning that she has severe symptoms from the lung  consolidation, did not respond to outpatient antibiotics but has minimal leukocytosis and minimally elevated procalcitonin level.  Given family history of lung cancer, need to rule out lung cancer.  I would obtain a CT chest with contrast.  Discussed with patient and husband and they are agreeable. Continue to monitor symptoms Continue bronchodilators, incentive spirometry, flutter valve Recent Labs  Lab 01/09/24 1547 01/12/24 1439 01/12/24 1525 01/13/24 0508 01/14/24 0847  WBC 12.1* 12.8*  --  11.0* 11.3*  LATICACIDVEN 0.9  --  0.8  --   --   PROCALCITON  --   --   --  0.10  --    Hypokalemia Potassium level improved with replacement. Recent Labs  Lab 01/09/24 1547 01/12/24 1439 01/12/24 1746 01/13/24 0508 01/14/24 0847  K 3.3* 2.9*  --  3.7 3.7  MG  --   --  1.9  --   --   PHOS  --   --  3.1  --   --    Type 2 diabetes mellitus A1c 5.2 on 5/2 PTA meds-none Continue SSI/Accu-Cheks Recent Labs  Lab 01/13/24 0742 01/13/24 1155 01/13/24 1555 01/13/24 2127 01/14/24 0817  GLUCAP 122* 86 111* 130* 171*   HTN Continue losartan   Hyperlipidemia Crestor    H/o Graves' disease Currently not on any medicine for hypothyroidism or hypothyroidism   Depression Continue sertraline    Obesity 2 Body mass index is 37.44 kg/m. Patient has been advised to make an attempt to improve diet and exercise patterns to aid in weight loss.   Mobility: Encourage ambulation.  Independent at baseline  Goals of care   Code Status: Full Code     DVT prophylaxis:  enoxaparin  (LOVENOX ) injection 40 mg Start: 01/12/24 2200   Antimicrobials: IV Rocephin  Fluid: None Consultants: None Family Communication: Husband at bedside  Status: Observation Level of care:  Med-Surg   Patient is from: Home Needs to continue in-hospital care: Needs IV antibiotics.  Needs CT scan of chest Anticipated d/c to: Hopefully home in 1 to 2 days    Diet:  Diet Order             Diet Carb Modified  Fluid consistency: Thin; Room service appropriate? Yes  Diet effective now                   Scheduled Meds:  enoxaparin  (LOVENOX ) injection  40 mg Subcutaneous QHS   insulin  aspart  0-15 Units Subcutaneous TID WC   insulin  aspart  0-5 Units Subcutaneous QHS   losartan   25 mg Oral Daily   rosuvastatin   5 mg Oral Daily   sertraline   50 mg Oral Daily    PRN meds: acetaminophen  **OR** acetaminophen , ipratropium-albuterol , ondansetron  **OR** ondansetron  (ZOFRAN ) IV, senna-docusate   Infusions:   cefTRIAXone  (ROCEPHIN )  IV 2 g (01/13/24 1654)   doxycycline  (VIBRAMYCIN ) IV 100 mg (01/14/24 0352)    Antimicrobials: Anti-infectives (From admission, onward)    Start     Dose/Rate Route Frequency Ordered Stop   01/13/24 1600  cefTRIAXone  (ROCEPHIN ) 2 g in sodium chloride  0.9 % 100 mL IVPB        2 g 200 mL/hr over 30 Minutes Intravenous Every 24 hours 01/12/24 2018 01/17/24 1559   01/13/24 0400  doxycycline  (VIBRAMYCIN ) 100 mg in sodium chloride  0.9 % 250 mL IVPB        100 mg 125 mL/hr over 120 Minutes Intravenous Every 12 hours 01/12/24 2018 01/18/24 0359   01/12/24 1615  cefTRIAXone  (ROCEPHIN ) 2 g in sodium chloride  0.9 % 100 mL IVPB        2 g 200 mL/hr over 30 Minutes Intravenous  Once 01/12/24 1607 01/12/24 1641   01/12/24 1615  doxycycline  (VIBRAMYCIN ) 100 mg in sodium chloride  0.9 % 250 mL IVPB        100 mg 125 mL/hr over 120 Minutes Intravenous  Once 01/12/24 1607 01/12/24 1927       Objective: Vitals:   01/14/24 0353 01/14/24 0822  BP: 136/71 (!) 154/62  Pulse: 86 77  Resp: (!) 22 17  Temp: 98.3 F (36.8 C) 98 F (36.7 C)  SpO2: 93% 99%    Intake/Output Summary (Last 24 hours) at 01/14/2024 1039 Last data filed at 01/13/2024 1800 Gross per 24 hour  Intake 315.72 ml  Output --  Net 315.72 ml   Filed Weights   01/12/24 1434  Weight: 102.1 kg   Weight change:  Body mass index is 37.44 kg/m.   Physical Exam: General exam: Pleasant, elderly Caucasian  female.  Not feel good today Skin: No rashes, lesions or ulcers. HEENT: Atraumatic, normocephalic, no obvious bleeding Lungs: Has crackles in left lung base  CVS: S1, S2, no murmur,   GI/Abd: Soft, nontender, nondistended, bowel sound present,   CNS: Alert, awake, oriented x 3 Psychiatry: Mood appropriate,  Extremities: No pedal edema, no calf tenderness,   Data Review: I have personally reviewed the laboratory data and studies available.  F/u labs ordered Unresulted Labs (From admission, onward)     Start     Ordered   01/12/24 1732  Legionella Pneumophila  Serogp 1 Ur Ag  (COPD / Pneumonia / Cellulitis / Lower Extremity Wound (Diabetic Foot Infection))  Once,   R        01/12/24 1731   01/12/24 1732  Expectorated Sputum Assessment w Gram Stain, Rflx to Resp Cult  (COPD / Pneumonia / Cellulitis / Lower Extremity Wound (Diabetic Foot Infection))  Once,   R        01/12/24 1731            Signed, Hoyt Macleod, MD [facility] 01/14/2024

## 2024-01-14 NOTE — Plan of Care (Signed)
  Problem: Health Behavior/Discharge Planning: Goal: Ability to manage health-related needs will improve Outcome: Progressing   Problem: Clinical Measurements: Goal: Respiratory complications will improve Outcome: Progressing   Problem: Activity: Goal: Risk for activity intolerance will decrease Outcome: Progressing   Problem: Activity: Goal: Ability to tolerate increased activity will improve Outcome: Progressing

## 2024-01-14 NOTE — Plan of Care (Signed)

## 2024-01-15 DIAGNOSIS — J189 Pneumonia, unspecified organism: Secondary | ICD-10-CM | POA: Diagnosis not present

## 2024-01-15 LAB — GLUCOSE, CAPILLARY: Glucose-Capillary: 95 mg/dL (ref 70–99)

## 2024-01-15 MED ORDER — GUAIFENESIN ER 600 MG PO TB12: 600.0000 mg | ORAL_TABLET | Freq: Two times a day (BID) | ORAL | 0 refills | Status: AC

## 2024-01-15 MED ORDER — CEFDINIR 300 MG PO CAPS
300.0000 mg | ORAL_CAPSULE | Freq: Two times a day (BID) | ORAL | 0 refills | Status: AC
Start: 2024-01-15 — End: 2024-01-20

## 2024-01-15 MED ORDER — FLORANEX PO PACK: 1.0000 g | PACK | Freq: Three times a day (TID) | ORAL | 0 refills | Status: AC

## 2024-01-15 MED ORDER — DOXYCYCLINE HYCLATE 100 MG PO CAPS: 100.0000 mg | ORAL_CAPSULE | Freq: Two times a day (BID) | ORAL | 0 refills | Status: AC

## 2024-01-15 NOTE — Progress Notes (Signed)
 SATURATION QUALIFICATIONS: (This note is used to comply with regulatory documentation for home oxygen)  Patient Saturations on Room Air at Rest = 99%  Patient Saturations on Room Air while Ambulating = 94%  Patient Saturations on 2 Liters of oxygen while Ambulating = 99%  Patient does not qualify for home oxygen based on the above readings.

## 2024-01-15 NOTE — Discharge Summary (Signed)
 Physician Discharge Summary  Penny Dawson WGN:[id number] DOB: 1951/01/27 DOA: 01/12/2024  PCP: Lanae Pinal, MD  Admit date: 01/12/2024 Discharge date: 01/15/2024  Admitted From: Home Discharge disposition: Home  Recommendations at discharge:  Complete the course of antibiotics with 5 more days of oral Omnicef and 3 more days of oral doxycycline  with probiotics. Mucinex for cough for 7 days Please follow-up with your primary care provider for the need of repeat imagings.  Brief narrative: Penny Dawson is a 73 y.o. female with PMH significant for obesity, HTN, HLD, Graves' disease, depression 5/1, patient presented to the ED with complaint of shortness of breath, fever, chills, headache, frequent urination for 1 week.   4/24, she started having fevers, chills and headaches with frequent urination. 4/25, PCP started her on Keflex despite which her symptoms did not improve and she also developed dry cough. 4/28, went to PCP for follow-up.  Had a syncopal episode.  She was sent to ED at [facility].  X-ray showed left lower lobe pneumonia.  Per patient, she was discharged home on oral cefpodoxime .  At home, she continued to have symptoms and also developed a fever of 102.4.  Also had associated fatigue, poor oral intake and hence advised by her PCP to return to ED on 5/1  In the ED, patient had temperature of 99.2, hemodynamically stable Labs showed WC count of 12.8, potassium low at 2.9, lactic acid 0.8 Chest x-ray showed increased density of airspace consolidation in the posterior left lower lobe. Patient was started on IV Rocephin  Admitted to [facility]  5/3, CT chest with contrast showed -Left lower lobe consolidation.  Follow-up imaging recommended to ensure resolution -Multiple prominent left paratracheal lymph nodes are identified measuring up to 8 mm. Subcarinal lymph node has a short axis of 1.3 cm. These are nonspecific, do not meet CT criteria for adenopathy and likely  reactive. -Splenomegaly with multiple scattered low-attenuation foci throughout the liver measure up to 2 cm with Hounsfield units between 27 and 45. Recommend follow-up imaging with nonemergent contrast enhanced MRI or CT for more definitive characterization. -Indeterminate sclerotic lesions within the T4 and T11 vertebra. Additional small sclerotic foci noted within the lower cervical and thoracic spine. These are indeterminate. Consider further evaluation with nuclear medicine bone scan.  Subjective: Patient was seen and examined this morning.   Feels better today.  Ambulated multiple laps on the hallway without supplemental oxygen today. Husband was at bedside.  We discussed about the CT scan findings from yesterday and the need to follow-up. I have sent text messages to her PCP Dr. Candance Certain for the need of follow-up.  Hospital course: Community-acquired pneumonia Failed outpatient therapy after diagnosis of pneumonia 3 days prior to presentation. Chest x-ray on admission showed increased density of airspace consolidation in posterior left lower lobe WBC count was elevated, procalcitonin mildly elevated Started on IV Rocephin  and IV doxycycline  on admission. Because of the severity of her symptoms, CT chest was obtained which confirmed dense consolidation.  Did not show any suspicious mass in the lung but a follow-up with imaging will be needed after resolution of pneumonia. She is clinically feeling much better today.  Not requiring supplemental oxygen today at rest or ambulation. Feels stable enough for discharge home today. Plan to discharge her on oral Omnicef for 5 days and doxycycline  for 3 more days with probiotics. I have sent text messages to her PCP Dr. Candance Certain for the need of follow-up. Recent Labs  Lab 01/09/24 1547 01/12/24 1439 01/12/24  1525 01/13/24 0508 01/14/24 0847  WBC 12.1* 12.8*  --  11.0* 11.3*  LATICACIDVEN 0.9  --  0.8  --   --   PROCALCITON  --   --   --   0.10  --    Indeterminate liver lesions and vertebral lesions CT chest 5/3 as above noted indeterminate lesions in liver and vertebrae.  No clear evidence of malignancy.  No prior history of cancer.  LFTs normal.  Total protein level normal. I do not think she would need any further inpatient workup for these findings. I have sent text messages to her PCP Dr. Candance Certain for the need of follow-up.  Hypokalemia Potassium level improved with replacement. Recent Labs  Lab 01/09/24 1547 01/12/24 1439 01/12/24 1746 01/13/24 0508 01/14/24 0847  K 3.3* 2.9*  --  3.7 3.7  MG  --   --  1.9  --   --   PHOS  --   --  3.1  --   --    Type 2 diabetes mellitus A1c 5.2 on 5/2 PTA meds-none Continue SSI/Accu-Cheks Recent Labs  Lab 01/14/24 0817 01/14/24 1240 01/14/24 1604 01/14/24 2114 01/15/24 0711  GLUCAP 171* 85 153* 120* 95   HTN Continue losartan   Hyperlipidemia Crestor    H/o Graves' disease Currently not on any medicine for hypothyroidism or hypothyroidism   Depression Continue sertraline    Obesity 2 Body mass index is 37.44 kg/m. Patient has been advised to make an attempt to improve diet and exercise patterns to aid in weight loss.  Goals of care   Code Status: Full Code   Diet:  Diet Order             Diet general           Diet Carb Modified Fluid consistency: Thin; Room service appropriate? Yes  Diet effective now                   Nutritional status:  Body mass index is 37.44 kg/m.       Wounds:  -    Discharge Exam:   Vitals:   01/14/24 0353 01/14/24 0822 01/14/24 1948 01/15/24 0534  BP: 136/71 (!) 154/62 (!) 149/61 (!) 135/59  Pulse: 86 77 86 80  Resp: (!) 22 17 18 20   Temp: 98.3 F (36.8 C) 98 F (36.7 C) 98.2 F (36.8 C) 98 F (36.7 C)  TempSrc: Oral Oral Oral Oral  SpO2: 93% 99% 96% 97%  Weight:      Height:        Body mass index is 37.44 kg/m.  General exam: Pleasant, elderly Caucasian female.  Skin: No rashes, lesions  or ulcers. HEENT: Atraumatic, normocephalic, no obvious bleeding Lungs: Improving crackles in left lung base  CVS: S1, S2, no murmur,   GI/Abd: Soft, nontender, nondistended, bowel sound present,   CNS: Alert, awake, oriented x 3 Psychiatry: Mood appropriate,  Extremities: No pedal edema, no calf tenderness,   Follow ups:    Follow-up Information     Koirala, Dibas, MD Follow up.   Specialty: Family Medicine Contact information: [address] [place] [address] [phone]                 Discharge Instructions:   Discharge Instructions     Call MD for:  difficulty breathing, headache or visual disturbances   Complete by: As directed    Call MD for:  extreme fatigue   Complete by: As directed  Call MD for:  hives   Complete by: As directed    Call MD for:  persistant dizziness or light-headedness   Complete by: As directed    Call MD for:  persistant nausea and vomiting   Complete by: As directed    Call MD for:  severe uncontrolled pain   Complete by: As directed    Call MD for:  temperature >100.4   Complete by: As directed    Diet general   Complete by: As directed    Discharge instructions   Complete by: As directed    Recommendations at discharge:   Complete the course of antibiotics with 5 more days of oral Omnicef and 3 more days of oral doxycycline  with probiotics.  Mucinex for cough for 7 days  Please follow-up with your primary care provider for the need of repeat imagings.  General discharge instructions: Follow with Primary MD Candance Certain, Dibas, MD in 7 days  Please request your PCP  to go over your hospital tests, procedures, radiology results at the follow up. Please get your medicines reviewed and adjusted.  Your PCP may decide to repeat certain labs or tests as needed. Do not drive, operate heavy machinery, perform activities at heights, swimming or participation in water activities or provide baby sitting services  if your were admitted for syncope or siezures until you have seen by Primary MD or a Neurologist and advised to do so again. [place]  Controlled Substance Reporting System database was reviewed. Do not drive, operate heavy machinery, perform activities at heights, swim, participate in water activities or provide baby-sitting services while on medications for pain, sleep and mood until your outpatient physician has reevaluated you and advised to do so again.  You are strongly recommended to comply with the dose, frequency and duration of prescribed medications. Activity: As tolerated with Full fall precautions use walker/cane & assistance as needed Avoid using any recreational substances like cigarette, tobacco, alcohol, or non-prescribed drug. If you experience worsening of your admission symptoms, develop shortness of breath, life threatening emergency, suicidal or homicidal thoughts you must seek medical attention immediately by calling 911 or calling your MD immediately  if symptoms less severe. You must read complete instructions/literature along with all the possible adverse reactions/side effects for all the medicines you take and that have been prescribed to you. Take any new medicine only after you have completely understood and accepted all the possible adverse reactions/side effects.  Wear Seat belts while driving. You were cared for by a hospitalist during your hospital stay. If you have any questions about your discharge medications or the care you received while you were in the hospital after you are discharged, you can call the unit and ask to speak with the hospitalist or the covering physician. Once you are discharged, your primary care physician will handle any further medical issues. Please note that NO REFILLS for any discharge medications will be authorized once you are discharged, as it is imperative that you return to your primary care physician (or establish a relationship with a  primary care physician if you do not have one).   Increase activity slowly   Complete by: As directed        Discharge Medications:   Allergies as of 01/15/2024       Reactions   Macrolides And Ketolides Hives   Clindamycin/lincomycin Rash   Also, had a fever with clindamycin. Mycin drugs per patient        Medication List  STOP taking these medications    azithromycin 250 MG tablet Commonly known as: ZITHROMAX   cefpodoxime  100 MG tablet Commonly known as: VANTIN    Cephalexin 500 MG tablet       TAKE these medications    cefdinir 300 MG capsule Commonly known as: OMNICEF Take 1 capsule (300 mg total) by mouth 2 (two) times daily for 5 days.   cyclobenzaprine 5 MG tablet Commonly known as: FLEXERIL Take 5 mg by mouth at bedtime as needed.   doxycycline  100 MG capsule Commonly known as: VIBRAMYCIN  Take 1 capsule (100 mg total) by mouth 2 (two) times daily for 3 days.   guaiFENesin 600 MG 12 hr tablet Commonly known as: MUCINEX Take 1 tablet (600 mg total) by mouth 2 (two) times daily for 7 days.   lactobacillus Pack Take 1 packet (1 g total) by mouth 3 (three) times daily with meals for 7 days.   loratadine 10 MG tablet Commonly known as: CLARITIN Take 10 mg by mouth daily as needed for allergies.   losartan  25 MG tablet Commonly known as: COZAAR  Take 25 mg by mouth daily.   MULTIVITAMIN & MINERAL PO Take 1 capsule by mouth daily.   PRESERVISION AREDS 2 PO Take 1 tablet by mouth in the morning and at bedtime.   Ozempic (1 MG/DOSE) 4 MG/3ML Sopn Generic drug: Semaglutide (1 MG/DOSE) Inject 1 mg into the skin once a week.   rosuvastatin  5 MG tablet Commonly known as: CRESTOR  Take 5 mg by mouth daily.   sertraline  50 MG tablet Commonly known as: ZOLOFT  Take 50 mg by mouth daily.         The results of significant diagnostics from this hospitalization (including imaging, microbiology, ancillary and laboratory) are listed below for  reference.    Procedures and Diagnostic Studies:   DG Chest 2 View Result Date: 01/12/2024 CLINICAL DATA:  SOB EXAM: CHEST - 2 VIEW COMPARISON:  January 09, 2024 FINDINGS: More dense airspace consolidation within the posterior left lower lobe. No pneumothorax or pleural effusion. No cardiomegaly. No acute fracture or destructive lesion. Multilevel degenerative disc disease of the spine. IMPRESSION: Increased density of the airspace consolidation in the posterior left lower lobe. No parapneumonic effusion. A follow-up chest radiograph in 6-12 weeks is recommended to document resolution once treatment is complete. Electronically Signed   By: Rance Burrows M.D.   On: 01/12/2024 17:05     Labs:   Basic Metabolic Panel: Recent Labs  Lab 01/09/24 1547 01/12/24 1439 01/12/24 1746 01/13/24 0508 01/14/24 0847  NA 138 137  --  139 138  K 3.3* 2.9*  --  3.7 3.7  CL 101 99  --  105 103  CO2 26 27  --  25 22  GLUCOSE 131* 149*  --  124* 187*  BUN 12 14  --  11 12  CREATININE 0.85 0.77  --  0.65 0.71  CALCIUM  9.4 8.7*  --  8.3* 8.5*  MG  --   --  1.9  --   --   PHOS  --   --  3.1  --   --    GFR Estimated Creatinine Clearance: 73 mL/min (by C-G formula based on SCr of 0.71 mg/dL). Liver Function Tests: Recent Labs  Lab 01/09/24 1547 01/12/24 1439  AST 17 26  ALT 16 26  ALKPHOS 84 82  BILITOT 0.8 0.8  PROT 6.4* 6.7  ALBUMIN 3.8 2.9*   No results for input(s): "LIPASE", "AMYLASE" in the  last 168 hours. No results for input(s): "AMMONIA" in the last 168 hours. Coagulation profile No results for input(s): "INR", "PROTIME" in the last 168 hours.  CBC: Recent Labs  Lab 01/09/24 1547 01/12/24 1439 01/13/24 0508 01/14/24 0847  WBC 12.1* 12.8* 11.0* 11.3*  NEUTROABS  --   --   --  8.8*  HGB 11.6* 12.2 11.1* 11.4*  HCT 33.6* 38.9 35.4* 37.5  MCV 87.5 92.8 93.4 95.7  PLT 152 178 196 235   Cardiac Enzymes: No results for input(s): "CKTOTAL", "CKMB", "CKMBINDEX", "TROPONINI" in  the last 168 hours. BNP: Invalid input(s): "POCBNP" CBG: Recent Labs  Lab 01/14/24 0817 01/14/24 1240 01/14/24 1604 01/14/24 2114 01/15/24 0711  GLUCAP 171* 85 153* 120* 95   D-Dimer No results for input(s): "DDIMER" in the last 72 hours. Hgb A1c Recent Labs    01/13/24 0508  HGBA1C 5.2   Lipid Profile No results for input(s): "CHOL", "HDL", "LDLCALC", "TRIG", "CHOLHDL", "LDLDIRECT" in the last 72 hours. Thyroid  function studies No results for input(s): "TSH", "T4TOTAL", "T3FREE", "THYROIDAB" in the last 72 hours.  Invalid input(s): "FREET3" Anemia work up No results for input(s): "VITAMINB12", "FOLATE", "FERRITIN", "TIBC", "IRON", "RETICCTPCT" in the last 72 hours. Microbiology Recent Results (from the past 240 hours)  Blood culture (routine x 2)     Status: None   Collection Time: 01/09/24  2:12 PM   Specimen: BLOOD LEFT FOREARM  Result Value Ref Range Status   Specimen Description   Final    BLOOD LEFT FOREARM Performed at [facility], [address] [place], [place] [address]    Special Requests   Final    BOTTLES DRAWN AEROBIC AND ANAEROBIC Blood Culture adequate volume Performed at [facility], [address], [place], [place] [address]    Culture   Final    NO GROWTH 5 DAYS Performed at [facility], [address], [place], [place] [address]    Report Status 01/14/2024 FINAL  Final  Resp panel by RT-PCR (RSV, Flu A&B, Covid) Anterior Nasal Swab     Status: None   Collection Time: 01/09/24  3:47 PM   Specimen: Anterior Nasal Swab  Result Value Ref Range Status   SARS Coronavirus 2 by RT PCR NEGATIVE NEGATIVE Final    Comment: (NOTE) SARS-CoV-2 target nucleic acids are NOT DETECTED.  The SARS-CoV-2 RNA is generally detectable in upper respiratory specimens during the acute phase of infection. The lowest concentration of SARS-CoV-2 viral copies this assay can detect is 138 copies/mL. A negative result does not preclude  SARS-Cov-2 infection and should not be used as the sole basis for treatment or other patient management decisions. A negative result may occur with  improper specimen collection/handling, submission of specimen other than nasopharyngeal swab, presence of viral mutation(s) within the areas targeted by this assay, and inadequate number of viral copies(<138 copies/mL). A negative result must be combined with clinical observations, patient history, and epidemiological information. The expected result is Negative.  Fact Sheet for Patients:  BloggerCourse.com  Fact Sheet for Healthcare Providers:  SeriousBroker.it  This test is no t yet approved or cleared by the [place]  FDA and  has been authorized for detection and/or diagnosis of SARS-CoV-2 by FDA under an Emergency Use Authorization (EUA). This EUA will remain  in effect (meaning this test can be used) for the duration of the COVID-19 declaration under Section 564(b)(1) of the Act, 21 U.S.C.section 360bbb-3(b)(1), unless the authorization is terminated  or revoked sooner.  Influenza A by PCR NEGATIVE NEGATIVE Final   Influenza B by PCR NEGATIVE NEGATIVE Final    Comment: (NOTE) The Xpert Xpress SARS-CoV-2/FLU/RSV plus assay is intended as an aid in the diagnosis of influenza from Nasopharyngeal swab specimens and should not be used as a sole basis for treatment. Nasal washings and aspirates are unacceptable for Xpert Xpress SARS-CoV-2/FLU/RSV testing.  Fact Sheet for Patients: BloggerCourse.com  Fact Sheet for Healthcare Providers: SeriousBroker.it  This test is not yet approved or cleared by the [place]  FDA and has been authorized for detection and/or diagnosis of SARS-CoV-2 by FDA under an Emergency Use Authorization (EUA). This EUA will remain in effect (meaning this test can be used) for the duration of  the COVID-19 declaration under Section 564(b)(1) of the Act, 21 U.S.C. section 360bbb-3(b)(1), unless the authorization is terminated or revoked.     Resp Syncytial Virus by PCR NEGATIVE NEGATIVE Final    Comment: (NOTE) Fact Sheet for Patients: BloggerCourse.com  Fact Sheet for Healthcare Providers: SeriousBroker.it  This test is not yet approved or cleared by the [place]  FDA and has been authorized for detection and/or diagnosis of SARS-CoV-2 by FDA under an Emergency Use Authorization (EUA). This EUA will remain in effect (meaning this test can be used) for the duration of the COVID-19 declaration under Section 564(b)(1) of the Act, 21 U.S.C. section 360bbb-3(b)(1), unless the authorization is terminated or revoked.  Performed at [facility], [address] [place], [place] [address]   Blood culture (routine x 2)     Status: None   Collection Time: 01/09/24  4:15 PM   Specimen: BLOOD RIGHT FOREARM  Result Value Ref Range Status   Specimen Description   Final    BLOOD RIGHT FOREARM Performed at [facility], [address], [place], [place] [address]    Special Requests   Final    BOTTLES DRAWN AEROBIC AND ANAEROBIC Blood Culture adequate volume Performed at [facility], [address] [place], [place] [address]    Culture   Final    NO GROWTH 5 DAYS Performed at [facility], [address], [place], [place] [address]    Report Status 01/14/2024 FINAL  Final  MRSA Next Gen by PCR, Nasal     Status: None   Collection Time: 01/12/24  8:30 PM   Specimen: Nasal Mucosa; Nasal Swab  Result Value Ref Range Status   MRSA by PCR Next Gen NOT DETECTED NOT DETECTED Final    Comment: (NOTE) The GeneXpert MRSA Assay (FDA approved for NASAL specimens only), is one component of a comprehensive MRSA colonization surveillance program. It is not intended to diagnose MRSA  infection nor to guide or monitor treatment for MRSA infections. Test performance is not FDA approved in patients less than 33 years old. Performed at [facility], [address], [place], [place] [address]   Respiratory (~20 pathogens) panel by PCR     Status: None   Collection Time: 01/12/24  8:30 PM   Specimen: Nasopharyngeal Swab; Respiratory  Result Value Ref Range Status   Adenovirus NOT DETECTED NOT DETECTED Final   Coronavirus 229E NOT DETECTED NOT DETECTED Final    Comment: (NOTE) The Coronavirus on the Respiratory Panel, DOES NOT test for the novel  Coronavirus (2019 nCoV)    Coronavirus HKU1 NOT DETECTED NOT DETECTED Final   Coronavirus NL63 NOT DETECTED NOT DETECTED Final   Coronavirus OC43 NOT DETECTED NOT DETECTED Final   Metapneumovirus  NOT DETECTED NOT DETECTED Final   Rhinovirus / Enterovirus NOT DETECTED NOT DETECTED Final   Influenza A NOT DETECTED NOT DETECTED Final   Influenza B NOT DETECTED NOT DETECTED Final   Parainfluenza Virus 1 NOT DETECTED NOT DETECTED Final   Parainfluenza Virus 2 NOT DETECTED NOT DETECTED Final   Parainfluenza Virus 3 NOT DETECTED NOT DETECTED Final   Parainfluenza Virus 4 NOT DETECTED NOT DETECTED Final   Respiratory Syncytial Virus NOT DETECTED NOT DETECTED Final   Bordetella pertussis NOT DETECTED NOT DETECTED Final   Bordetella Parapertussis NOT DETECTED NOT DETECTED Final   Chlamydophila pneumoniae NOT DETECTED NOT DETECTED Final   Mycoplasma pneumoniae NOT DETECTED NOT DETECTED Final    Comment: Performed at [facility], [address], [place], [place] [address]    Time coordinating discharge: 45 minutes  Signed: Omri Bertran  [facility] 01/15/2024, 10:23 AM

## 2024-01-16 LAB — LEGIONELLA PNEUMOPHILA SEROGP 1 UR AG: L. pneumophila Serogp 1 Ur Ag: NEGATIVE

## 2024-01-17 DIAGNOSIS — J189 Pneumonia, unspecified organism: Secondary | ICD-10-CM | POA: Diagnosis not present

## 2024-02-15 DIAGNOSIS — R161 Splenomegaly, not elsewhere classified: Secondary | ICD-10-CM | POA: Diagnosis not present

## 2024-02-15 DIAGNOSIS — M899 Disorder of bone, unspecified: Secondary | ICD-10-CM | POA: Diagnosis not present

## 2024-02-15 DIAGNOSIS — K769 Liver disease, unspecified: Secondary | ICD-10-CM | POA: Diagnosis not present

## 2024-02-23 ENCOUNTER — Other Ambulatory Visit (HOSPITAL_COMMUNITY): Payer: Self-pay | Admitting: Family Medicine

## 2024-02-23 ENCOUNTER — Encounter (HOSPITAL_COMMUNITY): Payer: Self-pay | Admitting: Family Medicine

## 2024-02-23 ENCOUNTER — Other Ambulatory Visit: Payer: Self-pay | Admitting: Family Medicine

## 2024-02-23 DIAGNOSIS — R161 Splenomegaly, not elsewhere classified: Secondary | ICD-10-CM

## 2024-02-23 DIAGNOSIS — K769 Liver disease, unspecified: Secondary | ICD-10-CM

## 2024-02-23 DIAGNOSIS — M899 Disorder of bone, unspecified: Secondary | ICD-10-CM

## 2024-02-27 ENCOUNTER — Other Ambulatory Visit (HOSPITAL_COMMUNITY): Payer: Self-pay | Admitting: Family Medicine

## 2024-02-27 DIAGNOSIS — R161 Splenomegaly, not elsewhere classified: Secondary | ICD-10-CM

## 2024-02-27 DIAGNOSIS — K769 Liver disease, unspecified: Secondary | ICD-10-CM

## 2024-02-27 DIAGNOSIS — M899 Disorder of bone, unspecified: Secondary | ICD-10-CM

## 2024-03-08 ENCOUNTER — Ambulatory Visit (HOSPITAL_COMMUNITY)
Admission: RE | Admit: 2024-03-08 | Discharge: 2024-03-08 | Disposition: A | Discharge status: Home / Self Care | Source: Ambulatory Visit | Attending: Family Medicine | Admitting: Family Medicine

## 2024-03-08 ENCOUNTER — Encounter (HOSPITAL_COMMUNITY)
Admission: RE | Admit: 2024-03-08 | Discharge: 2024-03-08 | Disposition: A | Discharge status: Home / Self Care | Source: Ambulatory Visit | Attending: Family Medicine | Admitting: Family Medicine

## 2024-03-08 DIAGNOSIS — K769 Liver disease, unspecified: Secondary | ICD-10-CM | POA: Insufficient documentation

## 2024-03-08 DIAGNOSIS — R161 Splenomegaly, not elsewhere classified: Secondary | ICD-10-CM | POA: Diagnosis not present

## 2024-03-08 DIAGNOSIS — K8689 Other specified diseases of pancreas: Secondary | ICD-10-CM | POA: Diagnosis not present

## 2024-03-08 DIAGNOSIS — D7389 Other diseases of spleen: Secondary | ICD-10-CM | POA: Diagnosis not present

## 2024-03-08 DIAGNOSIS — Z96652 Presence of left artificial knee joint: Secondary | ICD-10-CM | POA: Diagnosis not present

## 2024-03-08 DIAGNOSIS — M899 Disorder of bone, unspecified: Secondary | ICD-10-CM | POA: Diagnosis not present

## 2024-03-08 DIAGNOSIS — D1803 Hemangioma of intra-abdominal structures: Secondary | ICD-10-CM | POA: Diagnosis not present

## 2024-03-08 MED ORDER — GADOBUTROL 1 MMOL/ML IV SOLN
10.0000 mL | Freq: Once | INTRAVENOUS | Status: AC | PRN
  Administered 2024-03-08: 10 mL via INTRAVENOUS

## 2024-03-08 MED ORDER — TECHNETIUM TC 99M MEDRONATE IV KIT
20.0000 | PACK | Freq: Once | INTRAVENOUS | Status: AC | PRN
  Administered 2024-03-08: 20.2 via INTRAVENOUS

## 2024-03-26 DIAGNOSIS — Z0389 Encounter for observation for other suspected diseases and conditions ruled out: Secondary | ICD-10-CM | POA: Diagnosis not present

## 2024-03-26 DIAGNOSIS — J189 Pneumonia, unspecified organism: Secondary | ICD-10-CM | POA: Diagnosis not present

## 2024-03-29 DIAGNOSIS — D3132 Benign neoplasm of left choroid: Secondary | ICD-10-CM | POA: Diagnosis not present

## 2024-03-29 DIAGNOSIS — E119 Type 2 diabetes mellitus without complications: Secondary | ICD-10-CM | POA: Diagnosis not present

## 2024-03-29 DIAGNOSIS — H5211 Myopia, right eye: Secondary | ICD-10-CM | POA: Diagnosis not present

## 2024-03-29 DIAGNOSIS — H52223 Regular astigmatism, bilateral: Secondary | ICD-10-CM | POA: Diagnosis not present

## 2024-03-29 DIAGNOSIS — H524 Presbyopia: Secondary | ICD-10-CM | POA: Diagnosis not present

## 2024-03-29 DIAGNOSIS — H353132 Nonexudative age-related macular degeneration, bilateral, intermediate dry stage: Secondary | ICD-10-CM | POA: Diagnosis not present

## 2024-03-29 DIAGNOSIS — H43813 Vitreous degeneration, bilateral: Secondary | ICD-10-CM | POA: Diagnosis not present

## 2024-03-29 DIAGNOSIS — H5202 Hypermetropia, left eye: Secondary | ICD-10-CM | POA: Diagnosis not present

## 2024-03-29 DIAGNOSIS — Z961 Presence of intraocular lens: Secondary | ICD-10-CM | POA: Diagnosis not present

## 2024-04-30 DIAGNOSIS — M1711 Unilateral primary osteoarthritis, right knee: Secondary | ICD-10-CM | POA: Diagnosis not present

## 2024-06-26 DIAGNOSIS — I10 Essential (primary) hypertension: Secondary | ICD-10-CM | POA: Diagnosis not present

## 2024-06-26 DIAGNOSIS — E1165 Type 2 diabetes mellitus with hyperglycemia: Secondary | ICD-10-CM | POA: Diagnosis not present

## 2024-06-26 DIAGNOSIS — E7800 Pure hypercholesterolemia, unspecified: Secondary | ICD-10-CM | POA: Diagnosis not present

## 2024-06-26 DIAGNOSIS — Z01818 Encounter for other preprocedural examination: Secondary | ICD-10-CM | POA: Diagnosis not present

## 2024-07-09 DIAGNOSIS — Z1231 Encounter for screening mammogram for malignant neoplasm of breast: Secondary | ICD-10-CM | POA: Diagnosis not present

## 2024-07-18 DIAGNOSIS — M1711 Unilateral primary osteoarthritis, right knee: Secondary | ICD-10-CM | POA: Diagnosis not present

## 2024-07-19 DIAGNOSIS — M6281 Muscle weakness (generalized): Secondary | ICD-10-CM | POA: Diagnosis not present

## 2024-07-19 DIAGNOSIS — M1711 Unilateral primary osteoarthritis, right knee: Secondary | ICD-10-CM | POA: Diagnosis not present

## 2024-07-19 DIAGNOSIS — M25661 Stiffness of right knee, not elsewhere classified: Secondary | ICD-10-CM | POA: Diagnosis not present

## 2024-08-02 DIAGNOSIS — M1711 Unilateral primary osteoarthritis, right knee: Secondary | ICD-10-CM | POA: Diagnosis not present

## 2024-08-02 DIAGNOSIS — G8918 Other acute postprocedural pain: Secondary | ICD-10-CM | POA: Diagnosis not present

## 2024-08-06 DIAGNOSIS — M25661 Stiffness of right knee, not elsewhere classified: Secondary | ICD-10-CM | POA: Diagnosis not present

## 2024-08-06 DIAGNOSIS — M6281 Muscle weakness (generalized): Secondary | ICD-10-CM | POA: Diagnosis not present

## 2024-08-06 DIAGNOSIS — Z96651 Presence of right artificial knee joint: Secondary | ICD-10-CM | POA: Diagnosis not present

## 2024-08-13 DIAGNOSIS — Z96651 Presence of right artificial knee joint: Secondary | ICD-10-CM | POA: Diagnosis not present

## 2024-08-13 DIAGNOSIS — M1711 Unilateral primary osteoarthritis, right knee: Secondary | ICD-10-CM | POA: Diagnosis not present

## 2024-08-15 DIAGNOSIS — M6281 Muscle weakness (generalized): Secondary | ICD-10-CM | POA: Diagnosis not present

## 2024-08-15 DIAGNOSIS — Z96651 Presence of right artificial knee joint: Secondary | ICD-10-CM | POA: Diagnosis not present

## 2024-08-15 DIAGNOSIS — M25661 Stiffness of right knee, not elsewhere classified: Secondary | ICD-10-CM | POA: Diagnosis not present
